# Patient Record
Sex: Male | Born: 1955 | Race: White | Hispanic: No | Marital: Married | Smoking: Former smoker
Health system: Southern US, Community
[De-identification: ages and names within clinical notes are randomized; demographics above are authoritative.]

## PROBLEM LIST (undated history)

## (undated) DIAGNOSIS — M450 Ankylosing spondylitis of multiple sites in spine: Secondary | ICD-10-CM

## (undated) DIAGNOSIS — Z87891 Personal history of nicotine dependence: Secondary | ICD-10-CM

## (undated) DIAGNOSIS — R079 Chest pain, unspecified: Secondary | ICD-10-CM

## (undated) HISTORY — DX: Ankylosing spondylitis of multiple sites in spine: M45.0

---

## 2007-04-20 ENCOUNTER — Emergency Department (HOSPITAL_COMMUNITY): Admission: EM | Admit: 2007-04-20 | Discharge: 2007-04-20 | Payer: Self-pay | Admitting: Emergency Medicine

## 2007-09-12 ENCOUNTER — Encounter (INDEPENDENT_AMBULATORY_CARE_PROVIDER_SITE_OTHER): Payer: Self-pay | Admitting: Internal Medicine

## 2007-09-12 ENCOUNTER — Ambulatory Visit: Payer: Self-pay | Admitting: Nurse Practitioner

## 2007-09-12 LAB — CONVERTED CEMR LAB
AST: 15 units/L (ref 0–37)
Albumin: 4.5 g/dL (ref 3.5–5.2)
Alkaline Phosphatase: 93 units/L (ref 39–117)
Basophils Absolute: 0 10*3/uL (ref 0.0–0.1)
Basophils Relative: 0 % (ref 0–1)
CO2: 23 meq/L (ref 19–32)
Chloride: 104 meq/L (ref 96–112)
Creatinine, Ser: 0.79 mg/dL (ref 0.40–1.50)
Hemoglobin: 15.5 g/dL (ref 13.0–17.0)
Lymphs Abs: 1.9 10*3/uL (ref 0.7–4.0)
Monocytes Absolute: 0.6 10*3/uL (ref 0.1–1.0)
RBC: 5.39 M/uL (ref 4.22–5.81)
RDW: 13.9 % (ref 11.5–15.5)
Rhuematoid fact SerPl-aCnc: <20 intl units/mL (ref 0–20)
Total Protein: 7.6 g/dL (ref 6.0–8.3)
WBC: 9.9 10*3/uL (ref 4.0–10.5)

## 2007-09-21 ENCOUNTER — Ambulatory Visit: Payer: Self-pay | Admitting: Internal Medicine

## 2007-09-21 DIAGNOSIS — M2550 Pain in unspecified joint: Secondary | ICD-10-CM | POA: Insufficient documentation

## 2007-09-25 ENCOUNTER — Encounter (INDEPENDENT_AMBULATORY_CARE_PROVIDER_SITE_OTHER): Payer: Self-pay | Admitting: Internal Medicine

## 2007-09-25 DIAGNOSIS — H209 Unspecified iridocyclitis: Secondary | ICD-10-CM

## 2007-10-09 ENCOUNTER — Encounter (INDEPENDENT_AMBULATORY_CARE_PROVIDER_SITE_OTHER): Payer: Self-pay | Admitting: Internal Medicine

## 2007-10-10 ENCOUNTER — Encounter (INDEPENDENT_AMBULATORY_CARE_PROVIDER_SITE_OTHER): Payer: Self-pay | Admitting: Internal Medicine

## 2007-10-23 ENCOUNTER — Telehealth (INDEPENDENT_AMBULATORY_CARE_PROVIDER_SITE_OTHER): Payer: Self-pay | Admitting: Internal Medicine

## 2007-10-29 ENCOUNTER — Telehealth (INDEPENDENT_AMBULATORY_CARE_PROVIDER_SITE_OTHER): Payer: Self-pay | Admitting: Internal Medicine

## 2007-12-12 ENCOUNTER — Encounter (INDEPENDENT_AMBULATORY_CARE_PROVIDER_SITE_OTHER): Payer: Self-pay | Admitting: Internal Medicine

## 2008-03-04 ENCOUNTER — Telehealth (INDEPENDENT_AMBULATORY_CARE_PROVIDER_SITE_OTHER): Payer: Self-pay | Admitting: Internal Medicine

## 2008-03-05 ENCOUNTER — Ambulatory Visit: Payer: Self-pay | Admitting: Internal Medicine

## 2008-03-07 ENCOUNTER — Ambulatory Visit: Payer: Self-pay | Admitting: Internal Medicine

## 2008-03-12 ENCOUNTER — Telehealth (INDEPENDENT_AMBULATORY_CARE_PROVIDER_SITE_OTHER): Payer: Self-pay | Admitting: Internal Medicine

## 2008-03-14 ENCOUNTER — Encounter (INDEPENDENT_AMBULATORY_CARE_PROVIDER_SITE_OTHER): Payer: Self-pay | Admitting: Internal Medicine

## 2008-03-31 ENCOUNTER — Telehealth (INDEPENDENT_AMBULATORY_CARE_PROVIDER_SITE_OTHER): Payer: Self-pay | Admitting: Internal Medicine

## 2008-04-10 ENCOUNTER — Telehealth (INDEPENDENT_AMBULATORY_CARE_PROVIDER_SITE_OTHER): Payer: Self-pay | Admitting: Internal Medicine

## 2008-05-09 ENCOUNTER — Ambulatory Visit: Payer: Self-pay | Admitting: Internal Medicine

## 2008-05-09 DIAGNOSIS — F17200 Nicotine dependence, unspecified, uncomplicated: Secondary | ICD-10-CM

## 2008-05-09 DIAGNOSIS — N410 Acute prostatitis: Secondary | ICD-10-CM

## 2008-05-09 LAB — CONVERTED CEMR LAB
Bilirubin Urine: NEGATIVE
Glucose, Urine, Semiquant: NEGATIVE
Nitrite: NEGATIVE
WBC Urine, dipstick: NEGATIVE
pH: 7

## 2008-05-20 ENCOUNTER — Encounter (INDEPENDENT_AMBULATORY_CARE_PROVIDER_SITE_OTHER): Payer: Self-pay | Admitting: Internal Medicine

## 2008-06-02 ENCOUNTER — Encounter (INDEPENDENT_AMBULATORY_CARE_PROVIDER_SITE_OTHER): Payer: Self-pay | Admitting: Internal Medicine

## 2008-06-02 DIAGNOSIS — M459 Ankylosing spondylitis of unspecified sites in spine: Secondary | ICD-10-CM

## 2008-06-12 ENCOUNTER — Encounter (INDEPENDENT_AMBULATORY_CARE_PROVIDER_SITE_OTHER): Payer: Self-pay | Admitting: Internal Medicine

## 2008-09-05 ENCOUNTER — Encounter (INDEPENDENT_AMBULATORY_CARE_PROVIDER_SITE_OTHER): Payer: Self-pay | Admitting: Internal Medicine

## 2008-09-19 ENCOUNTER — Encounter (INDEPENDENT_AMBULATORY_CARE_PROVIDER_SITE_OTHER): Payer: Self-pay | Admitting: Internal Medicine

## 2008-11-26 ENCOUNTER — Emergency Department (HOSPITAL_COMMUNITY): Admission: EM | Admit: 2008-11-26 | Discharge: 2008-11-26 | Payer: Self-pay | Admitting: Emergency Medicine

## 2009-01-17 HISTORY — PX: CARDIAC CATHETERIZATION: SHX172

## 2009-03-11 ENCOUNTER — Encounter (INDEPENDENT_AMBULATORY_CARE_PROVIDER_SITE_OTHER): Payer: Self-pay | Admitting: Internal Medicine

## 2009-04-09 ENCOUNTER — Ambulatory Visit: Payer: Self-pay | Admitting: Internal Medicine

## 2009-04-09 ENCOUNTER — Encounter (INDEPENDENT_AMBULATORY_CARE_PROVIDER_SITE_OTHER): Payer: Self-pay | Admitting: Internal Medicine

## 2009-04-09 ENCOUNTER — Observation Stay (HOSPITAL_COMMUNITY): Admission: EM | Admit: 2009-04-09 | Discharge: 2009-04-11 | Payer: Self-pay | Admitting: Emergency Medicine

## 2010-02-16 NOTE — Letter (Signed)
Summary: [facility]  [facility]   Imported By: Arta Bruce 12/15/2009 14:46:28  _____________________________________________________________________  External Attachment:    Type:   Image     Comment:   External Document

## 2010-02-16 NOTE — Letter (Signed)
Summary: [facility]  [facility]AT[facility]CL[facility]UN[facility]   Imported By: Arta Bruce 09/08/2009 12:56:57  _____________________________________________________________________  External Attachment:    Type:   Image     Comment:   External Document

## 2010-04-10 ENCOUNTER — Emergency Department (HOSPITAL_COMMUNITY)
Admission: EM | Admit: 2010-04-10 | Discharge: 2010-04-10 | Disposition: A | Discharge status: Home / Self Care | Payer: Medicaid Other | Attending: Emergency Medicine | Admitting: Emergency Medicine

## 2010-04-10 DIAGNOSIS — L03019 Cellulitis of unspecified finger: Secondary | ICD-10-CM | POA: Insufficient documentation

## 2010-04-10 DIAGNOSIS — M79609 Pain in unspecified limb: Secondary | ICD-10-CM | POA: Insufficient documentation

## 2010-04-10 DIAGNOSIS — L02519 Cutaneous abscess of unspecified hand: Secondary | ICD-10-CM | POA: Insufficient documentation

## 2010-04-10 DIAGNOSIS — M7989 Other specified soft tissue disorders: Secondary | ICD-10-CM | POA: Insufficient documentation

## 2010-04-10 DIAGNOSIS — M069 Rheumatoid arthritis, unspecified: Secondary | ICD-10-CM | POA: Insufficient documentation

## 2010-04-11 ENCOUNTER — Emergency Department (HOSPITAL_COMMUNITY)
Admission: EM | Admit: 2010-04-11 | Discharge: 2010-04-11 | Disposition: A | Discharge status: Home / Self Care | Payer: Medicaid Other | Attending: Emergency Medicine | Admitting: Emergency Medicine

## 2010-04-11 DIAGNOSIS — M79609 Pain in unspecified limb: Secondary | ICD-10-CM | POA: Insufficient documentation

## 2010-04-11 DIAGNOSIS — L02519 Cutaneous abscess of unspecified hand: Secondary | ICD-10-CM | POA: Insufficient documentation

## 2010-04-11 DIAGNOSIS — Z09 Encounter for follow-up examination after completed treatment for conditions other than malignant neoplasm: Secondary | ICD-10-CM | POA: Insufficient documentation

## 2010-04-11 DIAGNOSIS — L03019 Cellulitis of unspecified finger: Secondary | ICD-10-CM | POA: Insufficient documentation

## 2010-04-11 DIAGNOSIS — Z79899 Other long term (current) drug therapy: Secondary | ICD-10-CM | POA: Insufficient documentation

## 2010-04-11 DIAGNOSIS — M069 Rheumatoid arthritis, unspecified: Secondary | ICD-10-CM | POA: Insufficient documentation

## 2010-04-12 LAB — LIPID PANEL
Cholesterol: 177 mg/dL (ref 0–200)
HDL: 27 mg/dL — ABNORMAL LOW (ref >39)
Total CHOL/HDL Ratio: 6.6 RATIO
Triglycerides: 213 mg/dL — ABNORMAL HIGH (ref <150)
VLDL: 43 mg/dL — ABNORMAL HIGH (ref 0–40)

## 2010-04-12 LAB — COMPREHENSIVE METABOLIC PANEL
ALT: 19 U/L (ref 0–53)
AST: 17 U/L (ref 0–37)
Alkaline Phosphatase: 64 U/L (ref 39–117)
BUN: 13 mg/dL (ref 6–23)
CO2: 22 mEq/L (ref 19–32)
Calcium: 8.9 mg/dL (ref 8.4–10.5)
Creatinine, Ser: 0.84 mg/dL (ref 0.4–1.5)
Potassium: 3.9 mEq/L (ref 3.5–5.1)
Sodium: 137 mEq/L (ref 135–145)
Total Protein: 6.8 g/dL (ref 6.0–8.3)

## 2010-04-12 LAB — BASIC METABOLIC PANEL
CO2: 27 mEq/L (ref 19–32)
Creatinine, Ser: 0.79 mg/dL (ref 0.4–1.5)
GFR calc Af Amer: >60 mL/min (ref >60)
Glucose, Bld: 96 mg/dL (ref 70–99)
Potassium: 4.1 mEq/L (ref 3.5–5.1)
Sodium: 136 mEq/L (ref 135–145)

## 2010-04-12 LAB — CARDIAC PANEL(CRET KIN+CKTOT+MB+TROPI)
CK, MB: 1 ng/mL (ref 0.3–4.0)
Relative Index: INVALID (ref 0.0–2.5)
Total CK: 62 U/L (ref 7–232)
Total CK: 67 U/L (ref 7–232)
Troponin I: 0.01 ng/mL (ref 0.00–0.06)
Troponin I: <0.01 ng/mL (ref 0.00–0.06)

## 2010-04-12 LAB — HEMOGLOBIN A1C: Mean Plasma Glucose: 111 mg/dL

## 2010-04-12 LAB — CBC
MCV: 90.3 fL (ref 78.0–100.0)
MCV: 90.4 fL (ref 78.0–100.0)
Platelets: 215 10*3/uL (ref 150–400)
RBC: 5.11 MIL/uL (ref 4.22–5.81)
RBC: 5.18 MIL/uL (ref 4.22–5.81)
WBC: 9.1 10*3/uL (ref 4.0–10.5)

## 2010-04-12 LAB — DIFFERENTIAL
Basophils Relative: 1 % (ref 0–1)
Eosinophils Absolute: 0.1 10*3/uL (ref 0.0–0.7)
Eosinophils Relative: 2 % (ref 0–5)
Lymphs Abs: 2.2 10*3/uL (ref 0.7–4.0)
Lymphs Abs: 4.1 10*3/uL — ABNORMAL HIGH (ref 0.7–4.0)
Monocytes Absolute: 0.7 10*3/uL (ref 0.1–1.0)
Neutro Abs: 5.2 10*3/uL (ref 1.7–7.7)
Neutrophils Relative %: 45 % (ref 43–77)
Neutrophils Relative %: 64 % (ref 43–77)

## 2010-04-12 LAB — PHOSPHORUS: Phosphorus: 3.1 mg/dL (ref 2.3–4.6)

## 2010-04-12 LAB — TSH: TSH: 2.162 u[IU]/mL (ref 0.350–4.500)

## 2010-04-12 LAB — CK TOTAL AND CKMB (NOT AT ARMC): CK, MB: 1 ng/mL (ref 0.3–4.0)

## 2010-04-12 LAB — MAGNESIUM: Magnesium: 2.1 mg/dL (ref 1.5–2.5)

## 2010-04-12 LAB — TROPONIN I: Troponin I: 0.01 ng/mL (ref 0.00–0.06)

## 2010-06-11 ENCOUNTER — Other Ambulatory Visit: Payer: Self-pay | Admitting: Family Medicine

## 2010-06-11 ENCOUNTER — Ambulatory Visit
Admission: RE | Admit: 2010-06-11 | Discharge: 2010-06-11 | Disposition: A | Discharge status: Home / Self Care | Payer: Medicaid Other | Source: Ambulatory Visit | Attending: Family Medicine | Admitting: Family Medicine

## 2010-06-11 DIAGNOSIS — R0600 Dyspnea, unspecified: Secondary | ICD-10-CM

## 2012-05-03 ENCOUNTER — Telehealth: Payer: Self-pay | Admitting: Family Medicine

## 2012-05-03 MED ORDER — HYDROCODONE-ACETAMINOPHEN 7.5-325 MG PO TABS: 1.0000 | ORAL_TABLET | Freq: Four times a day (QID) | ORAL | Status: DC | PRN

## 2012-05-03 NOTE — Telephone Encounter (Signed)
Confusion with pain med refill.  Per provider only refill #120 every 30 days.  Refill request from pharmacy reflected refill was last done on 3/28.  Pt states only picks up 1/2 of Rx at one time due to cost.  Pharmacy was called and it was confirmed last Rx refill called in on 04/02/12.  That the 3/28 date was when pt pick up last half of Rx and today was monthly due date. Rx was then approved.

## 2012-05-03 NOTE — Telephone Encounter (Signed)
Pharmacy aware

## 2012-05-03 NOTE — Telephone Encounter (Signed)
Pharmacy called.  Refill not due until 4/28

## 2012-05-03 NOTE — Telephone Encounter (Signed)
?   OK to Refill  

## 2012-05-03 NOTE — Telephone Encounter (Signed)
Refused until 4/28 not time

## 2012-05-03 NOTE — Addendum Note (Signed)
Addended by: Donne Anon on: 05/03/2012 12:29 PM   Modules accepted: Orders

## 2012-05-03 NOTE — Telephone Encounter (Addendum)
Last refill per pharmacy 04/13/12.  Refill denied TOO SOON. Routed to Dr Quillian Quince

## 2012-05-31 ENCOUNTER — Telehealth: Payer: Self-pay | Admitting: Family Medicine

## 2012-05-31 NOTE — Telephone Encounter (Signed)
Ok to refill 

## 2012-05-31 NOTE — Telephone Encounter (Signed)
?   OK to Refill  

## 2012-06-01 MED ORDER — HYDROCODONE-ACETAMINOPHEN 7.5-325 MG PO TABS: 1.0000 | ORAL_TABLET | Freq: Four times a day (QID) | ORAL | Status: DC | PRN

## 2012-06-01 NOTE — Telephone Encounter (Signed)
Rx Refilled  

## 2012-07-03 ENCOUNTER — Telehealth: Payer: Self-pay | Admitting: Family Medicine

## 2012-07-03 NOTE — Telephone Encounter (Signed)
Ok to refill 

## 2012-07-03 NOTE — Telephone Encounter (Signed)
?   OK to Refill  

## 2012-07-04 MED ORDER — HYDROCODONE-ACETAMINOPHEN 7.5-325 MG PO TABS: 1.0000 | ORAL_TABLET | Freq: Four times a day (QID) | ORAL | Status: DC | PRN

## 2012-07-04 NOTE — Telephone Encounter (Signed)
Rx Refilled  

## 2012-08-03 ENCOUNTER — Telehealth: Payer: Self-pay | Admitting: Family Medicine

## 2012-08-03 MED ORDER — HYDROCODONE-ACETAMINOPHEN 7.5-325 MG PO TABS: 1.0000 | ORAL_TABLET | Freq: Four times a day (QID) | ORAL | Status: DC | PRN

## 2012-08-03 NOTE — Telephone Encounter (Signed)
?   OK to Refill  

## 2012-08-03 NOTE — Telephone Encounter (Signed)
Rx Refilled  

## 2012-08-03 NOTE — Telephone Encounter (Signed)
Ok to refill 

## 2012-09-05 ENCOUNTER — Telehealth: Payer: Self-pay | Admitting: Family Medicine

## 2012-09-05 NOTE — Telephone Encounter (Signed)
Norco 7.5/325 mg tab 1 q6 hours prn pain

## 2012-09-06 MED ORDER — HYDROCODONE-ACETAMINOPHEN 7.5-325 MG PO TABS: 1.0000 | ORAL_TABLET | Freq: Four times a day (QID) | ORAL | Status: DC | PRN

## 2012-09-06 NOTE — Telephone Encounter (Signed)
?   OK to Refill  

## 2012-09-06 NOTE — Telephone Encounter (Signed)
Rx Refilled  

## 2012-09-06 NOTE — Telephone Encounter (Signed)
ok 

## 2012-10-02 ENCOUNTER — Telehealth: Payer: Self-pay | Admitting: Family Medicine

## 2012-10-02 MED ORDER — HYDROCODONE-ACETAMINOPHEN 7.5-325 MG PO TABS: 1.0000 | ORAL_TABLET | Freq: Four times a day (QID) | ORAL | Status: DC | PRN

## 2012-10-02 NOTE — Telephone Encounter (Signed)
Norco 7.5-325 mg 1 q6 hours prn

## 2012-10-02 NOTE — Telephone Encounter (Signed)
ok 

## 2012-10-02 NOTE — Telephone Encounter (Signed)
Rx Refilled  

## 2012-10-02 NOTE — Addendum Note (Signed)
Addended by: Legrand Rams B on: 10/02/2012 12:06 PM   Modules accepted: Orders

## 2012-10-02 NOTE — Telephone Encounter (Signed)
?   OK to Refill  

## 2012-10-23 ENCOUNTER — Telehealth: Payer: Self-pay | Admitting: Family Medicine

## 2012-10-23 NOTE — Telephone Encounter (Signed)
Paper filled out and .Patient aware per vm

## 2012-10-23 NOTE — Telephone Encounter (Signed)
Patient is calling about his renewal form for his handy -cap sticker . It expired last moth.  Patient is going out of town tomorrow.

## 2012-11-02 ENCOUNTER — Telehealth: Payer: Self-pay | Admitting: Family Medicine

## 2012-11-02 NOTE — Telephone Encounter (Signed)
Patient needs his NORCO refilled. Ran out on the 11-01-2012.

## 2012-11-02 NOTE — Telephone Encounter (Signed)
OK refill?? 

## 2012-11-05 ENCOUNTER — Telehealth: Payer: Self-pay | Admitting: Family Medicine

## 2012-11-05 MED ORDER — HYDROCODONE-ACETAMINOPHEN 7.5-325 MG PO TABS: 1.0000 | ORAL_TABLET | Freq: Four times a day (QID) | ORAL | Status: DC | PRN

## 2012-11-05 NOTE — Telephone Encounter (Signed)
Needs pain medication Norco-

## 2012-11-05 NOTE — Telephone Encounter (Signed)
Pt aware RX ready

## 2012-11-05 NOTE — Telephone Encounter (Signed)
Rx printed for provider signature.  Left pt mess to call back.  Inform he needs to come pick up

## 2012-11-30 ENCOUNTER — Telehealth: Payer: Self-pay | Admitting: *Deleted

## 2012-12-03 NOTE — Telephone Encounter (Signed)
.?   OK to Refill (Not due till 12/06/12)

## 2012-12-04 MED ORDER — HYDROCODONE-ACETAMINOPHEN 7.5-325 MG PO TABS: 1.0000 | ORAL_TABLET | Freq: Four times a day (QID) | ORAL | Status: DC | PRN

## 2012-12-04 NOTE — Telephone Encounter (Signed)
ok 

## 2012-12-04 NOTE — Telephone Encounter (Signed)
RX printed, left up front and patient aware to pick up per vm 

## 2012-12-31 ENCOUNTER — Telehealth: Payer: Self-pay | Admitting: Family Medicine

## 2012-12-31 NOTE — Telephone Encounter (Signed)
?   OK to Refill and to do 3 months?? 

## 2012-12-31 NOTE — Telephone Encounter (Signed)
Pt is needing a refill on his hydrocodone  Call back number is [phone]

## 2013-01-01 MED ORDER — HYDROCODONE-ACETAMINOPHEN 7.5-325 MG PO TABS: 1.0000 | ORAL_TABLET | Freq: Four times a day (QID) | ORAL | Status: DC | PRN

## 2013-01-01 NOTE — Telephone Encounter (Signed)
ok 

## 2013-01-01 NOTE — Telephone Encounter (Signed)
RX printed, left up front and patient aware to pick up per vm 

## 2013-05-02 ENCOUNTER — Telehealth: Payer: Self-pay | Admitting: Family Medicine

## 2013-05-02 MED ORDER — HYDROCODONE-ACETAMINOPHEN 7.5-325 MG PO TABS: 1.0000 | ORAL_TABLET | Freq: Four times a day (QID) | ORAL | Status: DC | PRN

## 2013-05-02 NOTE — Telephone Encounter (Signed)
Message copied by Ricard DillonWILLIS, SANDY B on Thu May 02, 2013  4:57 PM ------      Message from: Malvin JohnsBULLINS, SUSAN S      Created: Thu May 02, 2013  9:54 AM       PATIENT WOULD LIKE TO SPEAK WITH YOU ABOUT A REFILL       [phone] ------

## 2013-05-02 NOTE — Telephone Encounter (Signed)
rx ok's by Dr. Rachael FeePickard RX printed x 3 months, left up front and patient aware to pick up

## 2013-08-02 ENCOUNTER — Telehealth: Payer: Self-pay | Admitting: Family Medicine

## 2013-08-02 MED ORDER — HYDROCODONE-ACETAMINOPHEN 7.5-325 MG PO TABS: 1.0000 | ORAL_TABLET | Freq: Four times a day (QID) | ORAL | Status: DC | PRN

## 2013-08-02 NOTE — Telephone Encounter (Signed)
RX printed, left up front and patient aware to pick up and pt is aware that he requires any appt before any further refills.

## 2013-08-02 NOTE — Telephone Encounter (Signed)
ntbs

## 2013-08-02 NOTE — Telephone Encounter (Signed)
Patient would like to pick up rx for hydrocodone  Please call [phone] when ready for pick up

## 2013-08-02 NOTE — Telephone Encounter (Signed)
LMTRC

## 2013-08-02 NOTE — Telephone Encounter (Signed)
?   OK to Refill  - has not been here in the last year

## 2013-09-06 ENCOUNTER — Encounter: Payer: Self-pay | Admitting: Family Medicine

## 2013-09-06 ENCOUNTER — Ambulatory Visit (INDEPENDENT_AMBULATORY_CARE_PROVIDER_SITE_OTHER): Payer: Self-pay | Admitting: Family Medicine

## 2013-09-06 VITALS — BP 120/64 | HR 94 | Temp 99.0°F | Resp 18 | Ht 73.0 in | Wt 231.0 lb

## 2013-09-06 DIAGNOSIS — G8929 Other chronic pain: Secondary | ICD-10-CM

## 2013-09-06 DIAGNOSIS — Z79899 Other long term (current) drug therapy: Secondary | ICD-10-CM

## 2013-09-06 DIAGNOSIS — Z1211 Encounter for screening for malignant neoplasm of colon: Secondary | ICD-10-CM

## 2013-09-06 DIAGNOSIS — M549 Dorsalgia, unspecified: Secondary | ICD-10-CM

## 2013-09-06 DIAGNOSIS — F17200 Nicotine dependence, unspecified, uncomplicated: Secondary | ICD-10-CM | POA: Insufficient documentation

## 2013-09-06 DIAGNOSIS — M450 Ankylosing spondylitis of multiple sites in spine: Secondary | ICD-10-CM | POA: Insufficient documentation

## 2013-09-06 MED ORDER — HYDROCODONE-ACETAMINOPHEN 7.5-325 MG PO TABS: 1.0000 | ORAL_TABLET | Freq: Four times a day (QID) | ORAL | Status: DC | PRN

## 2013-09-06 NOTE — Addendum Note (Signed)
Addended by: Legrand RamsWILLIS, SANDY B on: 09/06/2013 04:34 PM   Modules accepted: Orders

## 2013-09-06 NOTE — Addendum Note (Signed)
Addended by: Reginia FortsATKINS, Rhyse Loux on: 09/06/2013 04:38 PM   Modules accepted: Orders

## 2013-09-06 NOTE — Progress Notes (Signed)
Subjective:    Patient ID: Joshua Kramer, male    DOB: 06/06/1955, 58 y.o.   MRN: [id number]  HPI Patient has a history of ankylosing spondylitis in multiple sites and spine. This causes severe disabling chronic low back pain and upper back pain. The patient is currently taking Norco 7.5/325 mg one tablet every 6 hours as needed for back pain. There is no evidence of abuse or diversion. He is coming in today for medicine check. He denies any drug abuse. He reports compliance with the medication. His pain control was adequate to allow him to work and function. Unfortunately does not have health insurance.  He is overdue for colonoscopy as well as a prostate exam. I recommended both knees today. He is also due to for fasting lab work including a CMP, fasting lipid panel, PSA, CBC. Otherwise he has no complaints. He denies any chest pain shortness of breath or dyspnea on exertion. He denies any coughing. He denies any hemoptysis. On exam today he is wheezing on expiration. He denies any history of emphysema. Past Medical History  Diagnosis Date  . Ankylosing spondylitis of multiple sites in spine   . Smoker    Current Outpatient Prescriptions on File Prior to Visit  Medication Sig Dispense Refill  . HYDROcodone-acetaminophen (NORCO) 7.5-325 MG per tablet Take 1 tablet by mouth every 6 (six) hours as needed.  120 tablet  0   No current facility-administered medications on file prior to visit.   No Known Allergies History   Social History  . Marital Status: Married    Spouse Name: N/A    Number of Children: N/A  . Years of Education: N/A   Occupational History  . Not on file.   Social History Main Topics  . Smoking status: Current Some Day Smoker  . Smokeless tobacco: Never Used  . Alcohol Use: Yes  . Drug Use: No  . Sexual Activity: Yes   Other Topics Concern  . Not on file   Social History Narrative  . No narrative on file      Review of Systems  All other systems reviewed  and are negative.      Objective:   Physical Exam  Vitals reviewed. Neck: Neck supple. No JVD present. No thyromegaly present.  Cardiovascular: Normal rate, regular rhythm and normal heart sounds.   No murmur heard. Pulmonary/Chest: Effort normal. No respiratory distress. He has wheezes. He has no rales.  Abdominal: Soft. Bowel sounds are normal. He exhibits no distension. There is no tenderness. There is no rebound and no guarding.  Musculoskeletal:       Cervical back: He exhibits decreased range of motion, tenderness and pain. He exhibits no spasm.       Lumbar back: He exhibits decreased range of motion, tenderness and pain. He exhibits no spasm.  Lymphadenopathy:    He has no cervical adenopathy.          Assessment & Plan:  Chronic back pain - Plan: COMPLETE METABOLIC PANEL WITH GFR, Drug Screen, Urine  Screen for colon cancer - Plan: Fecal Occult Blood, Guaiac  Encounter for long-term (current) use of other medications  Patient has chronic back pain due to ankylosing spondylitis. There is no evidence of abuse or diversion of his medication.  I will perform a urine drug screen to date monitor for compliance. I will also check a CMP to evaluate for renal or hepatic dysfunction. I have recommended return for a fasting lipid panel as well as  PSA. Patient declines this at the present time due to cost. I also recommended a colonoscopy as well as a prostate exam. The patient declined this as well. He does consent to fecal occult blood cards x3 to screen for colon cancer.

## 2013-09-07 LAB — DRUG SCREEN, URINE
Amphetamine Screen, Ur: NEGATIVE
Barbiturate Quant, Ur: NEGATIVE
Benzodiazepines.: NEGATIVE
CREATININE, U: 401.22 mg/dL
Cocaine Metabolites: NEGATIVE
METHADONE: NEGATIVE
Marijuana Metabolite: NEGATIVE
Opiates: POSITIVE — AB
Phencyclidine (PCP): NEGATIVE
Propoxyphene: NEGATIVE

## 2013-09-07 LAB — COMPLETE METABOLIC PANEL WITH GFR
ALK PHOS: 75 U/L (ref 39–117)
ALT: 11 U/L (ref 0–53)
AST: 11 U/L (ref 0–37)
Albumin: 4 g/dL (ref 3.5–5.2)
BILIRUBIN TOTAL: 0.5 mg/dL (ref 0.2–1.2)
BUN: 14 mg/dL (ref 6–23)
CO2: 27 mEq/L (ref 19–32)
Calcium: 8.8 mg/dL (ref 8.4–10.5)
Chloride: 100 mEq/L (ref 96–112)
Creat: 0.78 mg/dL (ref 0.50–1.35)
GFR, Est African American: >89 mL/min
GFR, Est Non African American: >89 mL/min
GLUCOSE: 125 mg/dL — AB (ref 70–99)
Potassium: 3.9 mEq/L (ref 3.5–5.3)
SODIUM: 139 meq/L (ref 135–145)
TOTAL PROTEIN: 6.8 g/dL (ref 6.0–8.3)

## 2013-10-02 ENCOUNTER — Telehealth: Payer: Self-pay | Admitting: Family Medicine

## 2013-10-02 NOTE — Telephone Encounter (Signed)
Patient calling to get the rx for his hydrocodone  [phone]

## 2013-10-02 NOTE — Telephone Encounter (Signed)
?   OK to Refill  

## 2013-10-03 NOTE — Telephone Encounter (Signed)
ok 

## 2013-10-04 MED ORDER — HYDROCODONE-ACETAMINOPHEN 7.5-325 MG PO TABS: 1.0000 | ORAL_TABLET | Freq: Four times a day (QID) | ORAL | Status: DC | PRN

## 2013-10-04 NOTE — Telephone Encounter (Signed)
RX printed, left up front and patient aware to pick up  

## 2013-10-30 ENCOUNTER — Telehealth: Payer: Self-pay | Admitting: Family Medicine

## 2013-10-30 NOTE — Telephone Encounter (Signed)
[phone] Patient is calling to get rx for his hydrocodone

## 2013-10-30 NOTE — Telephone Encounter (Signed)
?   OK to Refill  

## 2013-10-31 MED ORDER — HYDROCODONE-ACETAMINOPHEN 7.5-325 MG PO TABS: 1.0000 | ORAL_TABLET | Freq: Four times a day (QID) | ORAL | Status: DC | PRN

## 2013-10-31 NOTE — Telephone Encounter (Signed)
RX printed, left up front and patient aware to pick up via vm 

## 2013-10-31 NOTE — Telephone Encounter (Signed)
ok 

## 2013-11-28 ENCOUNTER — Telehealth: Payer: Self-pay | Admitting: Family Medicine

## 2013-11-28 MED ORDER — HYDROCODONE-ACETAMINOPHEN 7.5-325 MG PO TABS: 1.0000 | ORAL_TABLET | Freq: Four times a day (QID) | ORAL | Status: DC | PRN

## 2013-11-28 NOTE — Addendum Note (Signed)
Addended by: Legrand RamsWILLIS, SANDY B on: 11/28/2013 04:46 PM   Modules accepted: Orders

## 2013-11-28 NOTE — Telephone Encounter (Signed)
?   OK to Refill  

## 2013-11-28 NOTE — Telephone Encounter (Signed)
Patient is calling to get his rx for hydrocodone  [phone] he will pick up next week

## 2013-11-28 NOTE — Telephone Encounter (Signed)
RX printed, left up front and patient aware to pick up  

## 2013-11-28 NOTE — Telephone Encounter (Signed)
ok 

## 2013-12-25 ENCOUNTER — Telehealth: Payer: Self-pay | Admitting: Family Medicine

## 2013-12-25 NOTE — Telephone Encounter (Signed)
Ok to refill??  Last office visit 09/06/2013.  Last refill 11/28/2013.  Ok to fill multiple?

## 2013-12-25 NOTE — Telephone Encounter (Signed)
[phone]  Pt is needing a refill on HYDROcodone-acetaminophen (NORCO) 7.5-325 MG per tablet Can pt have a refill for several months so he doesn't have to make the drive from the other side of [place]

## 2013-12-26 MED ORDER — HYDROCODONE-ACETAMINOPHEN 7.5-325 MG PO TABS: 1.0000 | ORAL_TABLET | Freq: Four times a day (QID) | ORAL | Status: DC | PRN

## 2013-12-26 NOTE — Telephone Encounter (Signed)
Ok with multiple

## 2013-12-26 NOTE — Telephone Encounter (Signed)
Prescription printed x3 months and patient made aware to come to office to pick up per VM.

## 2014-03-21 ENCOUNTER — Telehealth: Payer: Self-pay | Admitting: Family Medicine

## 2014-03-21 NOTE — Telephone Encounter (Signed)
?   OK to Refill  

## 2014-03-21 NOTE — Telephone Encounter (Signed)
Patient is calling to say he needs refill on his hydrocodone, if possible 3 month rx since he lives in [place]  [phone]

## 2014-03-21 NOTE — Telephone Encounter (Signed)
ok 

## 2014-03-25 MED ORDER — HYDROCODONE-ACETAMINOPHEN 7.5-325 MG PO TABS: 1.0000 | ORAL_TABLET | Freq: Four times a day (QID) | ORAL | Status: DC | PRN

## 2014-03-25 NOTE — Telephone Encounter (Signed)
RX printed x 3 months, left up front and patient aware to pick up via vm 

## 2014-06-26 ENCOUNTER — Telehealth: Payer: Self-pay | Admitting: Family Medicine

## 2014-06-26 MED ORDER — HYDROCODONE-ACETAMINOPHEN 7.5-325 MG PO TABS: 1.0000 | ORAL_TABLET | Freq: Four times a day (QID) | ORAL | Status: DC | PRN

## 2014-06-26 NOTE — Telephone Encounter (Signed)
?   OK to Refill  

## 2014-06-26 NOTE — Telephone Encounter (Signed)
RX printed x 3 months, left up front and patient aware to pick up via vm 

## 2014-06-26 NOTE — Telephone Encounter (Signed)
ok 

## 2014-06-26 NOTE — Telephone Encounter (Signed)
Patient calling to get refill on hydrocodone that is due for refill on Saturday would like to pick up tomorrow if possible  [phone]

## 2014-09-26 ENCOUNTER — Telehealth: Payer: Self-pay | Admitting: Family Medicine

## 2014-09-26 MED ORDER — HYDROCODONE-ACETAMINOPHEN 7.5-325 MG PO TABS: 1.0000 | ORAL_TABLET | Freq: Four times a day (QID) | ORAL | Status: DC | PRN

## 2014-09-26 NOTE — Telephone Encounter (Signed)
RX printed, left up front and patient aware to pick up via vm and aware via vm to schedule appt.

## 2014-09-26 NOTE — Telephone Encounter (Signed)
Patient calling for rx of his hydrocodone  3 month supply if possible  [phone] (M)

## 2014-09-26 NOTE — Telephone Encounter (Signed)
?   OK to Refill  

## 2014-09-26 NOTE — Telephone Encounter (Signed)
Ok with 1 month, Hasn't been seen in 1 year.  NTBS.

## 2014-10-30 ENCOUNTER — Ambulatory Visit (INDEPENDENT_AMBULATORY_CARE_PROVIDER_SITE_OTHER): Payer: Self-pay | Admitting: Family Medicine

## 2014-10-30 ENCOUNTER — Encounter: Payer: Self-pay | Admitting: Family Medicine

## 2014-10-30 VITALS — BP 132/72 | HR 68 | Temp 98.2°F | Resp 16 | Ht 73.0 in | Wt 234.0 lb

## 2014-10-30 DIAGNOSIS — M459 Ankylosing spondylitis of unspecified sites in spine: Secondary | ICD-10-CM

## 2014-10-30 MED ORDER — HYDROCODONE-ACETAMINOPHEN 7.5-325 MG PO TABS: 1.0000 | ORAL_TABLET | Freq: Four times a day (QID) | ORAL | Status: DC | PRN

## 2014-10-30 MED ORDER — PREDNISONE 10 MG PO TABS
10.0000 mg | ORAL_TABLET | Freq: Every day | ORAL | Status: DC
Start: 2014-10-30 — End: 2015-05-29

## 2014-10-30 NOTE — Progress Notes (Signed)
   Subjective:    Patient ID: Joshua Kramer, male    DOB: 12/02/1955, 59 y.o.   MRN: [id number]  HPI Patient has a history of ankylosing spondylitis. He is tried numerous biologic agents to prevent exacerbations of this under the care of a rheumatologist. He failed many of them either due to cost or secondary infections. He was unable to tolerate enbrel or humira.  He tried sulfasalazine in the past without any benefit. He is currently using Norco 7.5/325 one 4 times a day. He states that his pain is getting worse. He continues to have severe pain in his lower back, in his hands, and in his knees. He has very little range of motion in his neck. His neck is essentially fused. He must turn his entire torso to look laterally. He has inflammatory swelling in the DIP joint on his right fourth digit.  He is interested in increasing the frequency of his pain medication. Past Medical History  Diagnosis Date  . Ankylosing spondylitis of multiple sites in spine (HCC)   . Smoker    No past surgical history on file. No current outpatient prescriptions on file prior to visit.   No current facility-administered medications on file prior to visit.   No Known Allergies Social History   Social History  . Marital Status: Married    Spouse Name: N/A  . Number of Children: N/A  . Years of Education: N/A   Occupational History  . Not on file.   Social History Main Topics  . Smoking status: Former Smoker    Quit date: 06/18/2014  . Smokeless tobacco: Never Used  . Alcohol Use: No  . Drug Use: No  . Sexual Activity: Yes   Other Topics Concern  . Not on file   Social History Narrative      Review of Systems  All other systems reviewed and are negative.      Objective:   Physical Exam  Constitutional: He appears well-developed and well-nourished.  Cardiovascular: Normal rate, regular rhythm and normal heart sounds.   Pulmonary/Chest: Effort normal and breath sounds normal.  Musculoskeletal:        Right knee: He exhibits decreased range of motion. Tenderness found. Medial joint line and lateral joint line tenderness noted.       Cervical back: He exhibits decreased range of motion and tenderness.       Thoracic back: He exhibits decreased range of motion and tenderness.       Right hand: He exhibits decreased range of motion, tenderness and bony tenderness.  Vitals reviewed.         Assessment & Plan:  Ankylosing spondylitis (HCC) - Plan: predniSONE (DELTASONE) 10 MG tablet, COMPLETE METABOLIC PANEL WITH GFR  I am fine refilling the patient's Norco. He can continue to take this 4 times a day. I will give him 120 tablets with 2 refills. Patient has no insurance and therefore he cannot afford the biologic agents. He cannot afford lab work. However I insisted that we get a CMP to monitor his kidney/liver function and blood sugar. I will also start the patient on prednisone 10 mg a day empirically for one month to see if his quality of life improves. If it does, we may want to consider continuing this long-term and then have a discussion regarding the side effects that prolonged glucocorticoid use can cause.

## 2014-10-31 ENCOUNTER — Encounter: Payer: Self-pay | Admitting: Family Medicine

## 2014-10-31 LAB — COMPLETE METABOLIC PANEL WITH GFR
ALT: 13 U/L (ref 9–46)
AST: 12 U/L (ref 10–35)
Albumin: 4.3 g/dL (ref 3.6–5.1)
Alkaline Phosphatase: 77 U/L (ref 40–115)
BILIRUBIN TOTAL: 0.8 mg/dL (ref 0.2–1.2)
BUN: 20 mg/dL (ref 7–25)
CHLORIDE: 104 mmol/L (ref 98–110)
CO2: 23 mmol/L (ref 20–31)
CREATININE: 0.72 mg/dL (ref 0.70–1.33)
Calcium: 9.5 mg/dL (ref 8.6–10.3)
GFR, Est African American: >89 mL/min (ref >=60)
GFR, Est Non African American: >89 mL/min (ref >=60)
GLUCOSE: 82 mg/dL (ref 70–99)
Potassium: 4.3 mmol/L (ref 3.5–5.3)
SODIUM: 137 mmol/L (ref 135–146)
TOTAL PROTEIN: 7.4 g/dL (ref 6.1–8.1)

## 2015-01-06 ENCOUNTER — Emergency Department (HOSPITAL_COMMUNITY)
Admission: EM | Admit: 2015-01-06 | Discharge: 2015-01-06 | Disposition: A | Discharge status: Home / Self Care | Payer: Medicaid Other | Attending: Emergency Medicine | Admitting: Emergency Medicine

## 2015-01-06 ENCOUNTER — Encounter (HOSPITAL_COMMUNITY): Payer: Self-pay | Admitting: Emergency Medicine

## 2015-01-06 DIAGNOSIS — Z87891 Personal history of nicotine dependence: Secondary | ICD-10-CM | POA: Insufficient documentation

## 2015-01-06 DIAGNOSIS — K047 Periapical abscess without sinus: Secondary | ICD-10-CM

## 2015-01-06 DIAGNOSIS — Z7952 Long term (current) use of systemic steroids: Secondary | ICD-10-CM | POA: Insufficient documentation

## 2015-01-06 DIAGNOSIS — K002 Abnormalities of size and form of teeth: Secondary | ICD-10-CM | POA: Insufficient documentation

## 2015-01-06 DIAGNOSIS — Z8739 Personal history of other diseases of the musculoskeletal system and connective tissue: Secondary | ICD-10-CM | POA: Insufficient documentation

## 2015-01-06 DIAGNOSIS — R110 Nausea: Secondary | ICD-10-CM | POA: Insufficient documentation

## 2015-01-06 DIAGNOSIS — R6883 Chills (without fever): Secondary | ICD-10-CM | POA: Insufficient documentation

## 2015-01-06 DIAGNOSIS — K029 Dental caries, unspecified: Secondary | ICD-10-CM | POA: Insufficient documentation

## 2015-01-06 DIAGNOSIS — R51 Headache: Secondary | ICD-10-CM | POA: Insufficient documentation

## 2015-01-06 MED ORDER — PENICILLIN V POTASSIUM 500 MG PO TABS: 500.0000 mg | ORAL_TABLET | Freq: Four times a day (QID) | ORAL | Status: DC

## 2015-01-06 MED ORDER — ONDANSETRON 4 MG PO TBDP
4.0000 mg | ORAL_TABLET | Freq: Once | ORAL | Status: AC
  Administered 2015-01-06: 4 mg via ORAL
  Filled 2015-01-06: qty 1

## 2015-01-06 MED ORDER — ACETAMINOPHEN 500 MG PO TABS
1000.0000 mg | ORAL_TABLET | Freq: Once | ORAL | Status: AC
  Administered 2015-01-06: 1000 mg via ORAL
  Filled 2015-01-06: qty 2

## 2015-01-06 MED ORDER — PENICILLIN V POTASSIUM 250 MG PO TABS
500.0000 mg | ORAL_TABLET | Freq: Once | ORAL | Status: AC
  Administered 2015-01-06: 500 mg via ORAL
  Filled 2015-01-06: qty 2

## 2015-01-06 MED ORDER — HYDROCODONE-ACETAMINOPHEN 5-325 MG PO TABS: 2.0000 | ORAL_TABLET | Freq: Once | ORAL | Status: DC

## 2015-01-06 MED ORDER — HYDROCODONE-ACETAMINOPHEN 5-325 MG PO TABS: 1.0000 | ORAL_TABLET | ORAL | Status: DC | PRN

## 2015-01-06 NOTE — ED Notes (Signed)
Pt here with right lower dental pain and swelling x 2 days

## 2015-01-06 NOTE — ED Notes (Signed)
Patient states he is driving and doesn't want narcotics here.   Patient wanted tylenol.  PA advised.

## 2015-01-06 NOTE — Discharge Instructions (Signed)
Dental Abscess °A dental abscess is a collection of pus in or around a tooth. °CAUSES °This condition is caused by a bacterial infection around the root of the tooth that involves the inner part of the tooth (pulp). It may result from: °· Severe tooth decay. °· Trauma to the tooth that allows bacteria to enter into the pulp, such as a broken or chipped tooth. °· Severe gum disease around a tooth. °SYMPTOMS °Symptoms of this condition include: °· Severe pain in and around the infected tooth. °· Swelling and redness around the infected tooth, in the mouth, or in the face. °· Tenderness. °· Pus drainage. °· Bad breath. °· Bitter taste in the mouth. °· Difficulty swallowing. °· Difficulty opening the mouth. °· Nausea. °· Vomiting. °· Chills. °· Swollen neck glands. °· Fever. °DIAGNOSIS °This condition is diagnosed with examination of the infected tooth. During the exam, your dentist may tap on the infected tooth. Your dentist will also ask about your medical and dental history and may order X-rays. °TREATMENT °This condition is treated by eliminating the infection. This may be done with: °· Antibiotic medicine. °· A root canal. This may be performed to save the tooth. °· Pulling (extracting) the tooth. This may also involve draining the abscess. This is done if the tooth cannot be saved. °HOME CARE INSTRUCTIONS °· Take medicines only as directed by your dentist. °· If you were prescribed antibiotic medicine, finish all of it even if you start to feel better. °· Rinse your mouth (gargle) often with salt water to relieve pain or swelling. °· Do not drive or operate heavy machinery while taking pain medicine. °· Do not apply heat to the outside of your mouth. °· Keep all follow-up visits as directed by your dentist. This is important. °SEEK MEDICAL CARE IF: °· Your pain is worse and is not helped by medicine. °SEEK IMMEDIATE MEDICAL CARE IF: °· You have a fever or chills. °· Your symptoms suddenly get worse. °· You have a  very bad headache. °· You have problems breathing or swallowing. °· You have trouble opening your mouth. °· You have swelling in your neck or around your eye. °  °This information is not intended to replace advice given to you by your health care provider. Make sure you discuss any questions you have with your health care provider. °  °Document Released: 01/03/2005 Document Revised: 05/20/2014 Document Reviewed: 12/31/2013 °Elsevier Interactive Patient Education ©2016 [employer] ° °Dental Pain °Dental pain may be caused by many things, including: °· Tooth decay (cavities or caries). Cavities expose the nerve of your tooth to air and hot or cold temperatures. This can cause pain or discomfort. °· Abscess or infection. A dental abscess is a collection of infected pus from a bacterial infection in the inner part of the tooth (pulp). It usually occurs at the end of the tooth's root. °· Injury. °· An unknown reason (idiopathic). °Your pain may be mild or severe. It may only occur when: °· You are chewing. °· You are exposed to hot or cold temperature. °· You are eating or drinking sugary foods or beverages, such as soda or candy. °Your pain may also be constant. °HOME CARE INSTRUCTIONS °Watch your dental pain for any changes. The following actions may help to lessen any discomfort that you are feeling: °· Take medicines only as directed by your dentist. °· If you were prescribed an antibiotic medicine, finish all of it even if you start to feel better. °· Keep all   follow-up visits as directed by your dentist. This is important. °· Do not apply heat to the outside of your face. °· Rinse your mouth or gargle with salt water if directed by your dentist. This helps with pain and swelling. °¨ You can make salt water by adding ¼ tsp of salt to 1 cup of warm water. °· Apply ice to the painful area of your face: °¨ Put ice in a plastic bag. °¨ Place a towel between your skin and the bag. °¨ Leave the ice on for 20 minutes,  2-3 times per day. °· Avoid foods or drinks that cause you pain, such as: °¨ Very hot or very cold foods or drinks. °¨ Sweet or sugary foods or drinks. °SEEK MEDICAL CARE IF: °· Your pain is not controlled with medicines. °· Your symptoms are worse. °· You have new symptoms. °SEEK IMMEDIATE MEDICAL CARE IF: °· You are unable to open your mouth. °· You are having trouble breathing or swallowing. °· You have a fever. °· Your face, neck, or jaw is swollen. °  °This information is not intended to replace advice given to you by your health care provider. Make sure you discuss any questions you have with your health care provider. °  °Document Released: 01/03/2005 Document Revised: 05/20/2014 Document Reviewed: 12/30/2013 °Elsevier Interactive Patient Education ©2016 [employer] ° °

## 2015-01-06 NOTE — ED Provider Notes (Signed)
CSN: [id number]     Arrival date & time 01/06/15  1333 History  By signing my name below, I, Joshua Kramer, attest that this documentation has been prepared under the direction and in the presence of Danelle BerryLeisa Ramonica Grigg, PA-C Electronically Signed: Gonzella LexKimberly Bianca Kramer, Scribe. 01/06/2015. 2:25 PM.    Chief Complaint  Patient presents with  . Dental Pain    The history is provided by the patient.     HPI Comments: Joshua Kramer is a 59 y.o. male who presents to the Emergency Department complaining of dental pain onset two or three days ago with associated chills and worsening pain this morning. He also reports a bad taste in his mouth but is unsure if it is drainage. He also reports nausea and HA this morning which have since resolved. Pt reports having a chip in one of his bottom right teeth. He notes that he has been unable to eat anything and reports that he has just been drinking water. Pt has taken Advil with little to no relief. He denies fever and vomiting. Pt is currently just taking Advil and Aleve. Pt has NKDA.   Past Medical History  Diagnosis Date  . Ankylosing spondylitis of multiple sites in spine (HCC)   . Smoker    History reviewed. No pertinent past surgical history. History reviewed. No pertinent family history. Social History  Substance Use Topics  . Smoking status: Former Smoker    Quit date: 06/18/2014  . Smokeless tobacco: Never Used  . Alcohol Use: No    Review of Systems  Constitutional: Positive for chills. Negative for fever.  HENT: Positive for dental problem.   Eyes: Negative.   Respiratory: Negative.   Cardiovascular: Negative.   Gastrointestinal: Positive for nausea. Negative for vomiting.  Endocrine: Negative.   Genitourinary: Negative.   Musculoskeletal: Negative.   Skin: Negative.  Negative for color change.  Neurological: Positive for headaches.  Hematological: Negative.   Psychiatric/Behavioral: Negative.     Allergies  Review of  patient's allergies indicates no known allergies.  Home Medications   Prior to Admission medications   Medication Sig Start Date End Date Taking? Authorizing Provider  HYDROcodone-acetaminophen (NORCO/VICODIN) 5-325 MG tablet Take 1-2 tablets by mouth every 4 (four) hours as needed. 01/06/15   Danelle BerryLeisa Fara Worthy, PA-C  penicillin v potassium (VEETID) 500 MG tablet Take 1 tablet (500 mg total) by mouth 4 (four) times daily. 01/06/15   Danelle BerryLeisa Indiana Pechacek, PA-C  predniSONE (DELTASONE) 10 MG tablet Take 1 tablet (10 mg total) by mouth daily with breakfast. 10/30/14   Donita BrooksWarren T Pickard, MD   BP 128/82 mmHg  Pulse 76  Temp(Src) 99.6 F (37.6 C) (Oral)  Resp 20  SpO2 98% Physical Exam  Constitutional: He is oriented to person, place, and time. He appears well-developed and well-nourished. No distress.  HENT:  Head: Normocephalic and atraumatic.    Right Ear: External ear normal.  Left Ear: External ear normal.  Nose: Nose normal.  Mouth/Throat: Uvula is midline and oropharynx is clear and moist. Mucous membranes are not pale, dry and not cyanotic. No trismus in the jaw. Abnormal dentition. Dental caries present. No uvula swelling. No oropharyngeal exudate, posterior oropharyngeal edema, posterior oropharyngeal erythema or tonsillar abscesses.    Eyes: Conjunctivae and EOM are normal. Pupils are equal, round, and reactive to light. Right eye exhibits no discharge. Left eye exhibits no discharge. No scleral icterus.  Neck: Normal range of motion. Neck supple. No JVD present. No tracheal deviation present. No thyromegaly  present.  Cardiovascular: Normal rate, regular rhythm and normal heart sounds.   Pulmonary/Chest: Effort normal and breath sounds normal. No stridor. No respiratory distress. He has no wheezes. He has no rales.  Abdominal: Soft. Bowel sounds are normal. He exhibits no distension. There is no tenderness.  Musculoskeletal: Normal range of motion. He exhibits no edema or tenderness.   Lymphadenopathy:    He has no cervical adenopathy.  Neurological: He is alert and oriented to person, place, and time. He has normal reflexes. No cranial nerve deficit. He exhibits normal muscle tone. Coordination normal.  Skin: Skin is warm and dry. No rash noted. He is not diaphoretic. No erythema. No pallor.  Psychiatric: He has a normal mood and affect. His behavior is normal. Judgment and thought content normal.  Nursing note and vitals reviewed.   ED Course  Procedures  DIAGNOSTIC STUDIES:    Oxygen Saturation is 98% on RA, normal by my interpretation.   COORDINATION OF CARE:  1:45 PM Will further assess pt with use of ultra-sound. Will administer pt Zofran and pain medication in the ED. Will prescribe pt antibiotics. Discussed treatment plan with pt at bedside and pt agreed to plan.   Imaging Review No results found. I have personally reviewed and evaluated these lab results as part of my medical decision-making.   MDM   Patient with toothache.  Right lower jaw swelling, without palpable area of fluctuance.  Pain and swelling x 1d, without erythema of fever. Exam unconcerning for Ludwig's angina or spread of infection.  Will treat with penicillin and pain medicine.  Urged patient to follow-up with dentist.    Final diagnoses:  Periapical abscess   Patient presents with significant swelling but no concern for Ludwig. He is unable to take narcotic pain medicine while in the ER due to driving himself here he requested Tylenol instead. He is dentist is out of town until after [employer] which is approximately 2 weeks away.  Return precautions were reviewed with the patient who verbalizes understanding. I contacted Dr. Norris Cross office who is on-call dental provider. An appointment was made for Joshua Kramer on 01/08/2015 at 10 AM, for further follow-up. Joshua Kramer will call them in the morning for further details.  He was discharged in stable condition with normal vital  signs    Danelle Berry, PA-C 01/06/15 1822  Benjiman Core, MD 01/07/15 2235

## 2015-01-22 ENCOUNTER — Telehealth: Payer: Self-pay | Admitting: Family Medicine

## 2015-01-22 MED ORDER — HYDROCODONE-ACETAMINOPHEN 7.5-325 MG PO TABS: 1.0000 | ORAL_TABLET | Freq: Four times a day (QID) | ORAL | Status: DC | PRN

## 2015-01-22 NOTE — Telephone Encounter (Signed)
RX printed x 3 months, left up front and patient aware to pick up tomorrow am via vm

## 2015-01-22 NOTE — Telephone Encounter (Signed)
?   OK to Refill  

## 2015-01-22 NOTE — Telephone Encounter (Signed)
Patient calling to get rx for his hydrocodone  [phone]

## 2015-01-22 NOTE — Telephone Encounter (Signed)
ok 

## 2015-03-03 ENCOUNTER — Encounter: Payer: Self-pay | Admitting: Family Medicine

## 2015-04-21 ENCOUNTER — Telehealth: Payer: Self-pay | Admitting: Family Medicine

## 2015-04-21 MED ORDER — HYDROCODONE-ACETAMINOPHEN 7.5-325 MG PO TABS: 1.0000 | ORAL_TABLET | Freq: Four times a day (QID) | ORAL | Status: DC | PRN

## 2015-04-21 NOTE — Telephone Encounter (Signed)
?   OK to Refill  

## 2015-04-21 NOTE — Telephone Encounter (Signed)
Okay for one month. Patient was last seen in October. Must be seen every 6 months for chronic narcotics

## 2015-04-21 NOTE — Telephone Encounter (Signed)
Pt is calling for a refill of Hydrocodone 7.5-325. He is hoping that Dr. Tanya NonesPickard can write him for 3 months to help save him the drive.  [phone]

## 2015-04-22 NOTE — Telephone Encounter (Signed)
RX printed, left up front and patient aware to pick up and to schedule an appt.

## 2015-05-18 DIAGNOSIS — R079 Chest pain, unspecified: Secondary | ICD-10-CM

## 2015-05-18 HISTORY — DX: Chest pain, unspecified: R07.9

## 2015-05-29 ENCOUNTER — Encounter: Payer: Self-pay | Admitting: Family Medicine

## 2015-05-29 ENCOUNTER — Ambulatory Visit (INDEPENDENT_AMBULATORY_CARE_PROVIDER_SITE_OTHER): Payer: Self-pay | Admitting: Family Medicine

## 2015-05-29 VITALS — BP 136/76 | HR 68 | Temp 98.2°F | Resp 16 | Ht 73.0 in | Wt 236.0 lb

## 2015-05-29 DIAGNOSIS — M459 Ankylosing spondylitis of unspecified sites in spine: Secondary | ICD-10-CM

## 2015-05-29 DIAGNOSIS — M549 Dorsalgia, unspecified: Secondary | ICD-10-CM

## 2015-05-29 DIAGNOSIS — G8929 Other chronic pain: Secondary | ICD-10-CM

## 2015-05-29 MED ORDER — HYDROCODONE-ACETAMINOPHEN 7.5-325 MG PO TABS: 1.0000 | ORAL_TABLET | Freq: Four times a day (QID) | ORAL | Status: DC | PRN

## 2015-05-29 NOTE — Progress Notes (Signed)
Subjective:    Patient ID: Joshua Kramer, male    DOB: 01-Aug-1955, 60 y.o.   MRN: [id number]  HPI 10/30/14 Patient has a history of ankylosing spondylitis. He is tried numerous biologic agents to prevent exacerbations of this under the care of a rheumatologist. He failed many of them either due to cost or secondary infections. He was unable to tolerate enbrel or humira.  He tried sulfasalazine in the past without any benefit. He is currently using Norco 7.5/325 one 4 times a day. He states that his pain is getting worse. He continues to have severe pain in his lower back, in his hands, and in his knees. He has very little range of motion in his neck. His neck is essentially fused. He must turn his entire torso to look laterally. He has inflammatory swelling in the DIP joint on his right fourth digit.  He is interested in increasing the frequency of his pain medication.  AT that time, my plan was: I am fine refilling the patient's Norco. He can continue to take this 4 times a day. I will give him 120 tablets with 2 refills. Patient has no insurance and therefore he cannot afford the biologic agents. He cannot afford lab work. However I insisted that we get a CMP to monitor his kidney/liver function and blood sugar. I will also start the patient on prednisone 10 mg a day empirically for one month to see if his quality of life improves. If it does, we may want to consider continuing this long-term and then have a discussion regarding the side effects that prolonged glucocorticoid use can cause.  05/29/15 Patient is here today requesting a refill on his pain medication. There is no evidence of abuse or diversion. Unfortunately the patient still does not have any health insurance. Therefore he still has not had a colonoscopy. He is overdue for a digital rectal exam and PSA. He is also overdue for fasting lipid panel. Based on his age, he would also qualify for hepatitis C screening. Based on his age he will be  due for the shingles vaccine. I have recommended all these things to the patient today. Obviously cost is a concern. He is interested in applying for disability and possibly Medicare/Medicaid. We had a discussion today about the process for doing this. Past Medical History  Diagnosis Date  . Ankylosing spondylitis of multiple sites in spine (HCC)   . Smoker    No past surgical history on file. Current Outpatient Prescriptions on File Prior to Visit  Medication Sig Dispense Refill  . HYDROcodone-acetaminophen (NORCO) 7.5-325 MG tablet Take 1 tablet by mouth every 6 (six) hours as needed. Do not fill until 03/24/15 120 tablet 0   No current facility-administered medications on file prior to visit.   No Known Allergies Social History   Social History  . Marital Status: Married    Spouse Name: N/A  . Number of Children: N/A  . Years of Education: N/A   Occupational History  . Not on file.   Social History Main Topics  . Smoking status: Former Smoker    Quit date: 06/18/2014  . Smokeless tobacco: Never Used  . Alcohol Use: No  . Drug Use: No  . Sexual Activity: Yes   Other Topics Concern  . Not on file   Social History Narrative      Review of Systems  All other systems reviewed and are negative.      Objective:   Physical Exam  Constitutional: He appears well-developed and well-nourished.  Cardiovascular: Normal rate, regular rhythm and normal heart sounds.   Pulmonary/Chest: Effort normal and breath sounds normal.  Musculoskeletal:       Right knee: He exhibits decreased range of motion. Tenderness found. Medial joint line and lateral joint line tenderness noted.       Cervical back: He exhibits decreased range of motion and tenderness.       Thoracic back: He exhibits decreased range of motion and tenderness.       Right hand: He exhibits decreased range of motion, tenderness and bony tenderness.  Vitals reviewed.         Assessment & Plan:  Ankylosing  spondylitis (HCC)  Chronic back pain  I refilled the patient's pain medication for a total of 3 months. There is no evidence of abuse or diversion. I recommended a colonoscopy, I recommended a PSA. I recommended a fasting lipid panel. I recommended a shingles vaccine. I recommended hepatitis C screening. The patient believes he will come back to have his cholesterol checked. I am fine with that. He would like to do this one of the time to make it more affordable which I certainly understand. He is also going to inquire about disability with social services

## 2015-05-31 ENCOUNTER — Encounter (HOSPITAL_COMMUNITY): Payer: Self-pay

## 2015-05-31 ENCOUNTER — Observation Stay (HOSPITAL_COMMUNITY)
Admission: EM | Admit: 2015-05-31 | Discharge: 2015-06-02 | Disposition: A | Discharge status: Home / Self Care | Payer: Self-pay | Attending: Cardiology | Admitting: Cardiology

## 2015-05-31 ENCOUNTER — Emergency Department (HOSPITAL_COMMUNITY): Payer: Self-pay

## 2015-05-31 DIAGNOSIS — R030 Elevated blood-pressure reading, without diagnosis of hypertension: Secondary | ICD-10-CM | POA: Insufficient documentation

## 2015-05-31 DIAGNOSIS — I472 Ventricular tachycardia: Secondary | ICD-10-CM | POA: Insufficient documentation

## 2015-05-31 DIAGNOSIS — I440 Atrioventricular block, first degree: Secondary | ICD-10-CM | POA: Insufficient documentation

## 2015-05-31 DIAGNOSIS — I4510 Unspecified right bundle-branch block: Secondary | ICD-10-CM | POA: Insufficient documentation

## 2015-05-31 DIAGNOSIS — Z87891 Personal history of nicotine dependence: Secondary | ICD-10-CM | POA: Insufficient documentation

## 2015-05-31 DIAGNOSIS — R001 Bradycardia, unspecified: Secondary | ICD-10-CM

## 2015-05-31 DIAGNOSIS — I4729 Other ventricular tachycardia: Secondary | ICD-10-CM

## 2015-05-31 DIAGNOSIS — R079 Chest pain, unspecified: Principal | ICD-10-CM | POA: Diagnosis present

## 2015-05-31 DIAGNOSIS — IMO0001 Reserved for inherently not codable concepts without codable children: Secondary | ICD-10-CM

## 2015-05-31 DIAGNOSIS — R0602 Shortness of breath: Secondary | ICD-10-CM | POA: Insufficient documentation

## 2015-05-31 DIAGNOSIS — E785 Hyperlipidemia, unspecified: Secondary | ICD-10-CM

## 2015-05-31 DIAGNOSIS — R9439 Abnormal result of other cardiovascular function study: Secondary | ICD-10-CM | POA: Insufficient documentation

## 2015-05-31 DIAGNOSIS — M459 Ankylosing spondylitis of unspecified sites in spine: Secondary | ICD-10-CM | POA: Diagnosis present

## 2015-05-31 HISTORY — DX: Personal history of nicotine dependence: Z87.891

## 2015-05-31 HISTORY — DX: Chest pain, unspecified: R07.9

## 2015-05-31 LAB — CBC
HEMATOCRIT: 44.3 % (ref 39.0–52.0)
HEMOGLOBIN: 14.9 g/dL (ref 13.0–17.0)
MCH: 29.3 pg (ref 26.0–34.0)
MCHC: 33.6 g/dL (ref 30.0–36.0)
MCV: 87.2 fL (ref 78.0–100.0)
Platelets: 264 10*3/uL (ref 150–400)
RBC: 5.08 MIL/uL (ref 4.22–5.81)
RDW: 13.6 % (ref 11.5–15.5)
WBC: 13.7 10*3/uL — ABNORMAL HIGH (ref 4.0–10.5)

## 2015-05-31 LAB — BASIC METABOLIC PANEL
ANION GAP: 10 (ref 5–15)
BUN: 19 mg/dL (ref 6–20)
CHLORIDE: 105 mmol/L (ref 101–111)
CO2: 23 mmol/L (ref 22–32)
Calcium: 9.4 mg/dL (ref 8.9–10.3)
Creatinine, Ser: 0.77 mg/dL (ref 0.61–1.24)
GFR calc non Af Amer: >60 mL/min (ref >60)
GLUCOSE: 161 mg/dL — AB (ref 65–99)
Potassium: 4.2 mmol/L (ref 3.5–5.1)
Sodium: 138 mmol/L (ref 135–145)

## 2015-05-31 LAB — HEPATIC FUNCTION PANEL
ALBUMIN: 4 g/dL (ref 3.5–5.0)
ALK PHOS: 73 U/L (ref 38–126)
ALT: 17 U/L (ref 17–63)
AST: 15 U/L (ref 15–41)
BILIRUBIN TOTAL: 1.1 mg/dL (ref 0.3–1.2)
Bilirubin, Direct: <0.1 mg/dL — ABNORMAL LOW (ref 0.1–0.5)
TOTAL PROTEIN: 7.8 g/dL (ref 6.5–8.1)

## 2015-05-31 LAB — LIPASE, BLOOD: Lipase: 23 U/L (ref 11–51)

## 2015-05-31 LAB — I-STAT TROPONIN, ED: TROPONIN I, POC: 0 ng/mL (ref 0.00–0.08)

## 2015-05-31 MED ORDER — NITROGLYCERIN 0.4 MG SL SUBL
0.4000 mg | SUBLINGUAL_TABLET | SUBLINGUAL | Status: AC | PRN
  Administered 2015-05-31 – 2015-06-01 (×3): 0.4 mg via SUBLINGUAL
  Filled 2015-05-31 (×2): qty 1

## 2015-05-31 MED ORDER — MORPHINE SULFATE (PF) 4 MG/ML IV SOLN
4.0000 mg | Freq: Once | INTRAVENOUS | Status: AC
  Administered 2015-05-31: 4 mg via INTRAVENOUS
  Filled 2015-05-31: qty 1

## 2015-05-31 MED ORDER — ASPIRIN 81 MG PO CHEW
324.0000 mg | CHEWABLE_TABLET | Freq: Once | ORAL | Status: AC
  Administered 2015-05-31: 324 mg via ORAL
  Filled 2015-05-31: qty 4

## 2015-05-31 MED ORDER — SODIUM CHLORIDE 0.9 % IV SOLN
INTRAVENOUS | Status: DC
  Administered 2015-05-31 – 2015-06-01 (×3): via INTRAVENOUS

## 2015-05-31 MED ORDER — NITROGLYCERIN 2 % TD OINT
1.0000 [in_us] | TOPICAL_OINTMENT | Freq: Once | TRANSDERMAL | Status: AC
  Administered 2015-05-31: 1 [in_us] via TOPICAL
  Filled 2015-05-31: qty 1

## 2015-05-31 MED ORDER — PANTOPRAZOLE SODIUM 40 MG IV SOLR
40.0000 mg | Freq: Once | INTRAVENOUS | Status: AC
  Administered 2015-05-31: 40 mg via INTRAVENOUS
  Filled 2015-05-31: qty 40

## 2015-05-31 NOTE — ED Notes (Signed)
Attempted to call report

## 2015-05-31 NOTE — ED Notes (Signed)
Pt here for chest pain, onset today while at rest, pt appears tired and pale,

## 2015-05-31 NOTE — ED Provider Notes (Signed)
CSN: [id number]     Arrival date & time 05/31/15  1954 History   First MD Initiated Contact with Patient 05/31/15 2039     Chief Complaint  Patient presents with  . Chest Pain     (Consider location/radiation/quality/duration/timing/severity/associated sxs/prior Treatment) HPI Chest pain started approximately 2 hours prior to arrival. Onset at rest. At onset, mild to moderate and escalating to severe. Pain in the central chest. Deep aching and sharp pain. Positive associated shortness of breath, nausea and weakness. Patient has no prior history of cardiac disease. No history of hypertension. Never similar chest pain. No radiation to back, neck or arms. Patient's only medical history is severe ankylosing spondylitis. He reports occasional use of NSAIDs but not regular. No history of peptic ulcer disease. No lower extremity swelling or calf pain. No history of DVT. Family history negative for early onset coronary artery disease. Past Medical History  Diagnosis Date  . Ankylosing spondylitis of multiple sites in spine (HCC)   . Smoker    History reviewed. No pertinent past surgical history. Family History  Problem Relation Age of Onset  . Arthritis Mother    Social History  Substance Use Topics  . Smoking status: Former Smoker    Quit date: 06/18/2014  . Smokeless tobacco: Never Used  . Alcohol Use: No    Review of Systems 10 Systems reviewed and are negative for acute change except as noted in the HPI.    Allergies  Review of patient's allergies indicates no known allergies.  Home Medications   Prior to Admission medications   Medication Sig Start Date End Date Taking? Authorizing Provider  HYDROcodone-acetaminophen (NORCO) 7.5-325 MG tablet Take 1 tablet by mouth every 6 (six) hours as needed. July 2017 RX 06/26/15   Donita Brooks, MD   BP 128/71 mmHg  Pulse 45  Temp(Src) 97.6 F (36.4 C) (Oral)  Resp 13  Ht 6' (1.829 m)  Wt 230 lb (104.327 kg)  BMI 31.19 kg/m2   SpO2 99% Physical Exam  Constitutional: He is oriented to person, place, and time. He appears well-developed and well-nourished.  Patient is pale and appears to be in severe pain. No respiratory distress. Normal mental status.  HENT:  Head: Normocephalic and atraumatic.  Mouth/Throat: Oropharynx is clear and moist.  Eyes: EOM are normal. Pupils are equal, round, and reactive to light.  Neck: Neck supple.  Cardiovascular: Regular rhythm, normal heart sounds and intact distal pulses.   Bradycardia  Pulmonary/Chest: Effort normal and breath sounds normal. No respiratory distress. He has no wheezes.  Abdominal: Soft. Bowel sounds are normal. He exhibits no distension. There is no tenderness.  Musculoskeletal: He exhibits no edema or tenderness.  Neurological: He is alert and oriented to person, place, and time. He exhibits normal muscle tone. Coordination normal.  Skin: There is pallor.  Mildly diaphoretic  Psychiatric: He has a normal mood and affect.    ED Course  Procedures (including critical care time) Labs Review Labs Reviewed  BASIC METABOLIC PANEL - Abnormal; Notable for the following:    Glucose, Bld 161 (*)    All other components within normal limits  CBC - Abnormal; Notable for the following:    WBC 13.7 (*)    All other components within normal limits  HEPATIC FUNCTION PANEL - Abnormal; Notable for the following:    Bilirubin, Direct <0.1 (*)    All other components within normal limits  LIPASE, BLOOD  I-STAT TROPOININ, ED    Imaging Review Dg  Chest 2 View  05/31/2015  CLINICAL DATA:  Initial valuation for acute central chest pain. EXAM: CHEST  2 VIEW COMPARISON:  Prior study from 06/11/2010 FINDINGS: The cardiac and mediastinal silhouettes are stable in size and contour, and remain within normal limits. The lungs are normally inflated. Mild diffuse bronchitic changes, similar to prior. No airspace consolidation, pleural effusion, or pulmonary edema is identified.  There is no pneumothorax. No acute osseous abnormality identified. IMPRESSION: 1. No active cardiopulmonary disease. 2. Mild diffuse bronchitic changes, similar to prior. Electronically Signed   By: Rise MuBenjamin  McClintock M.D.   On: 05/31/2015 22:05   I have personally reviewed and evaluated these images and lab results as part of my medical decision-making.   EKG Interpretation   Date/Time:  Sunday May 31 2015 20:02:24 EDT Ventricular Rate:  56 PR Interval:  248 QRS Duration: 152 QT Interval:  462 QTC Calculation: 445 R Axis:   94 Text Interpretation:  Sinus bradycardia with 1st degree A-V block Right  bundle branch block Abnormal ECG agree. more pronounced RBB, peaked t  wave. no STEMI Confirmed by Donnald GarrePfeiffer, MD, Lebron ConnersMarcy [phone]([id number]) on 05/31/2015  8:05:15 PM     Consult: Cardiology Dr. Sullivan LoneGilbert (21:25) as reviewed EKG and at this time no MI pattern present with right bundle branch block. Requests administration of nitroglycerin and will see patient in the emergency department. MDM   Final diagnoses:  Chest pain, unspecified chest pain type   Patient had acute onset of chest pain that rapidly worsened. This was central with associated shortness of breath, nausea and diaphoresis. EKG does not show a STEMI. Cardiac enzymes are not elevated. She has no associated abdominal pain. He has not had cough, fever or other pulmonary symptoms. At this time, patient will be admitted for continued rule out of MI. Cardiology has consulted and seen the patient in the emergency department.    Arby BarretteMarcy Huy Majid, MD 05/31/15 [phone]

## 2015-05-31 NOTE — ED Notes (Signed)
MD at bedside. 

## 2015-05-31 NOTE — H&P (Addendum)
History & Physical    Patient ID: Joshua Kramer MRN: [id number], DOB/AGE: January 02, 1956  Admit date: 05/31/2015 Primary Physician: Leo Grosser, MD  CC:  Chest pain  HPI    60 yo M w a h/o ankylosing spondylitis, prior tobacco abuse (quit 06/18/14), & hyperlipidemia presented this evening at 20:00 with a 1 hour episode of 10/10 chest pain that started while he was lying in bed.  He had associated diaphoresis, dyspnea, & lightheadedness.  He denied association of the pain with activity, oral intake, deep breaths, or position.  His ECG revealed a RBBB with sinus bradycardia & first degree AVB (progressed from his nonspecific intraventricular conduction block back in 2011)  With Morphine 4 mg given in the ED, his pain improved to a 8/10.  With NTG x 1, improved to a 5/10.  With a second NTG, improved to a 1/10.  He was also given ASA 324.    He had similar but mild pain back in 2011 when he underwent a cardiac catheterization that was unremarkable.  A TTE from that time revealed an EF of 55-60% with grade I diastolic dysfunction.  Since that time, he has not had further chest pain.  At baseline, his physical activity is somewhat limited by his ankylosing spondylitis as his neck is "90% fused."   Past Medical History   Past Medical History  Diagnosis Date  . Ankylosing spondylitis of multiple sites in spine (HCC)   . Smoker     History reviewed. No pertinent past surgical history.   Allergies  No Known Allergies  Home Medications    Prior to Admission medications   Medication Sig Start Date End Date Taking? Authorizing Provider  HYDROcodone-acetaminophen (NORCO) 7.5-325 MG tablet Take 1 tablet by mouth every 6 (six) hours as needed. July 2017 RX 06/26/15   Donita Brooks, MD   Family History    Family History  Problem Relation Age of Onset  . Arthritis Mother    Social History    Social History   Social History  . Marital Status: Married    Spouse Name: N/A  . Number of  Children: 1  . Years of Education: N/A   Occupational History  . Sales    Social History Main Topics  . Smoking status: Former Smoker    Quit date: 06/18/2014  . Smokeless tobacco: Never Used  . Alcohol Use: No  . Drug Use: No  . Sexual Activity: Yes   Other Topics Concern  . Not on file   Social History Narrative    Review of Systems    General:  No chills, fever, night sweats or weight changes.  Cardiovascular:  Positive for chest pain, diaphoresis, & lightheadedness.  No dyspnea on exertion, edema, orthopnea, palpitations, paroxysmal nocturnal dyspnea. Dermatological: No rash, lesions/masses Respiratory: Positive for dyspnea.  No cough. Urologic: No hematuria, dysuria Abdominal:   No nausea, vomiting, diarrhea, bright red blood per rectum, melena, or hematemesis Neurologic:  No visual changes, wkns, changes in mental status. All other systems reviewed and are otherwise negative except as noted above.  Physical Exam    Blood pressure 128/71, pulse 45, temperature 97.6 F (36.4 C), temperature source Oral, resp. rate 13, height 6' (1.829 m), weight 104.327 kg (230 lb), SpO2 99 %.  General: Pleasant, NAD, mildly diaphoretic  Psych: Normal affect. Neuro: Alert and oriented X 3. Moves all extremities spontaneously. HEENT: Normal  Neck: Supple without bruits or JVD. Lungs:  Resp regular and unlabored, CTA. Heart: RRR  no s3, s4, or murmurs.  No chest wall tenderness. Abdomen: Soft, non-tender, non-distended, BS + x 4.  Extremities: No clubbing, cyanosis or edema. DP/PT/Radials 2+ and equal bilaterally.  Labs    Troponin [facility](Point of Care Test)  Recent Labs  05/31/15 2018  TROPIPOC 0.00   No results for input(s): CKTOTAL, CKMB, TROPONINI in the last 72 hours. Lab Results  Component Value Date   WBC 13.7* 05/31/2015   HGB 14.9 05/31/2015   HCT 44.3 05/31/2015   MCV 87.2 05/31/2015   PLT 264 05/31/2015    Recent Labs Lab 05/31/15 2013  NA 138  K 4.2  CL 105    CO2 23  BUN 19  CREATININE 0.77  CALCIUM 9.4  PROT 7.8  BILITOT 1.1  ALKPHOS 73  ALT 17  AST 15  GLUCOSE 161*   Lab Results  Component Value Date   CHOL  04/09/2009    177        ATP III CLASSIFICATION:  <200     mg/dL   Desirable  147-829200-239  mg/dL   Borderline High  >=562>=240    mg/dL   High          HDL 27* 04/09/2009   LDLCALC * 04/09/2009    107        Total Cholesterol/HDL:CHD Risk Coronary Heart Disease Risk Table                     Men   Women  1/2 Average Risk   3.4   3.3  Average Risk       5.0   4.4  2 X Average Risk   9.6   7.1  3 X Average Risk  23.4   11.0        Use the calculated Patient Ratio above and the CHD Risk Table to determine the patient's CHD Risk.        ATP III CLASSIFICATION (LDL):  <100     mg/dL   Optimal  130-865100-129  mg/dL   Near or Above                    Optimal  130-159  mg/dL   Borderline  [id number]  mg/dL   High  >295>190     mg/dL   Very High   TRIG 284213* 04/09/2009   No results found for: [facility]   Radiology Studies    Dg Chest 2 View  05/31/2015  CLINICAL DATA:  Initial valuation for acute central chest pain. EXAM: CHEST  2 VIEW COMPARISON:  Prior study from 06/11/2010 FINDINGS: The cardiac and mediastinal silhouettes are stable in size and contour, and remain within normal limits. The lungs are normally inflated. Mild diffuse bronchitic changes, similar to prior. No airspace consolidation, pleural effusion, or pulmonary edema is identified. There is no pneumothorax. No acute osseous abnormality identified. IMPRESSION: 1. No active cardiopulmonary disease. 2. Mild diffuse bronchitic changes, similar to prior. Electronically Signed   By: Rise MuBenjamin  McClintock M.D.   On: 05/31/2015 22:05   Assessment & Plan    60 yo M w a h/o ankylosing spondylitis, prior tobacco abuse (quit 06/18/14), & prior hyperlipidemia presented with typical angina in that it responded to NTG.    # Chest pain - There are no overt ischemic changes on his ECG.  His first  troponin was negative.  Due to the sinus bradycardia, a right-sided ECG was checked (10:04 pm) & was negative for RV infarct.  His risk factors for CAD include his age, tobacco history, & hyperlipidemia. - Given his response to NTG in the ED, he was transitioned to a NTG patch. - In the meantime, will continue ASA.  Will also give Atorvastatin. - No BB with bradycardia. - Monitor on telemetry with serial cardiac enzymes. - Check a HgbA1c & fasting lipid panel.   - Depending on his status overnight, including trend of troponin, we will decide invasive versus non-invasive strategies for ischemic workup.  Will make NPO in case & order a resting TTE in the meantime.    # Sinus bradycardia - This is more prominent this evening that it seems to have previously been (though he has always tended to low in the high 50's to low 70's).  The patient's wife felt that this had been a chronic issue for him.   - Will check a TSH & continue to monitor on telemetry.  # Blood pressure elevated on presentation - This may have been related to his pain - Will continue to monitor.  # h/o HLD - He is not currently receiving treatment for this, though he was on his 2011 discharge summary. - Check a level as per above.    # h/o Ankylosing spondylitis - Will make his home pain medications available.    # PPX - Lovenox  # Full Code  Signed, Julaine Hua, MD 05/31/2015, 11:04 PM

## 2015-06-01 ENCOUNTER — Observation Stay (HOSPITAL_BASED_OUTPATIENT_CLINIC_OR_DEPARTMENT_OTHER): Payer: Self-pay

## 2015-06-01 ENCOUNTER — Observation Stay (HOSPITAL_COMMUNITY): Payer: Self-pay

## 2015-06-01 ENCOUNTER — Encounter (HOSPITAL_COMMUNITY): Payer: Self-pay | Admitting: Physician Assistant

## 2015-06-01 DIAGNOSIS — R079 Chest pain, unspecified: Secondary | ICD-10-CM

## 2015-06-01 LAB — COMPREHENSIVE METABOLIC PANEL
ALBUMIN: 3.3 g/dL — AB (ref 3.5–5.0)
ALT: 14 U/L — AB (ref 17–63)
ANION GAP: 6 (ref 5–15)
AST: 12 U/L — AB (ref 15–41)
Alkaline Phosphatase: 61 U/L (ref 38–126)
BILIRUBIN TOTAL: 0.7 mg/dL (ref 0.3–1.2)
BUN: 17 mg/dL (ref 6–20)
CALCIUM: 8.6 mg/dL — AB (ref 8.9–10.3)
CO2: 24 mmol/L (ref 22–32)
Chloride: 107 mmol/L (ref 101–111)
Creatinine, Ser: 0.61 mg/dL (ref 0.61–1.24)
GFR calc Af Amer: >60 mL/min (ref >60)
GLUCOSE: 107 mg/dL — AB (ref 65–99)
Potassium: 3.6 mmol/L (ref 3.5–5.1)
Sodium: 137 mmol/L (ref 135–145)
TOTAL PROTEIN: 6.5 g/dL (ref 6.5–8.1)

## 2015-06-01 LAB — CBC
HCT: 38.9 % — ABNORMAL LOW (ref 39.0–52.0)
HEMATOCRIT: 41.8 % (ref 39.0–52.0)
HEMOGLOBIN: 13.9 g/dL (ref 13.0–17.0)
Hemoglobin: 13 g/dL (ref 13.0–17.0)
MCH: 29.1 pg (ref 26.0–34.0)
MCH: 29.1 pg (ref 26.0–34.0)
MCHC: 33.3 g/dL (ref 30.0–36.0)
MCHC: 33.4 g/dL (ref 30.0–36.0)
MCV: 87 fL (ref 78.0–100.0)
MCV: 87.4 fL (ref 78.0–100.0)
PLATELETS: 224 10*3/uL (ref 150–400)
Platelets: 248 10*3/uL (ref 150–400)
RBC: 4.47 MIL/uL (ref 4.22–5.81)
RBC: 4.78 MIL/uL (ref 4.22–5.81)
RDW: 13.7 % (ref 11.5–15.5)
RDW: 13.8 % (ref 11.5–15.5)
WBC: 11.5 10*3/uL — AB (ref 4.0–10.5)
WBC: 12.4 10*3/uL — AB (ref 4.0–10.5)

## 2015-06-01 LAB — LIPID PANEL
Cholesterol: 185 mg/dL (ref 0–200)
HDL: 32 mg/dL — AB (ref >40)
LDL CALC: 142 mg/dL — AB (ref 0–99)
TRIGLYCERIDES: 57 mg/dL (ref <150)
Total CHOL/HDL Ratio: 5.8 RATIO
VLDL: 11 mg/dL (ref 0–40)

## 2015-06-01 LAB — TSH: TSH: 1.588 u[IU]/mL (ref 0.350–4.500)

## 2015-06-01 LAB — MAGNESIUM: MAGNESIUM: 2 mg/dL (ref 1.7–2.4)

## 2015-06-01 LAB — ECHOCARDIOGRAM COMPLETE
Height: 72 in
Weight: 3680 oz

## 2015-06-01 LAB — D-DIMER, QUANTITATIVE: D-Dimer, Quant: 0.37 ug/mL-FEU (ref 0.00–0.50)

## 2015-06-01 LAB — CREATININE, SERUM: Creatinine, Ser: 0.84 mg/dL (ref 0.61–1.24)

## 2015-06-01 LAB — TROPONIN I
Troponin I: <0.03 ng/mL (ref <0.031)
Troponin I: <0.03 ng/mL (ref <0.031)

## 2015-06-01 LAB — PROTIME-INR
INR: 1.07 (ref 0.00–1.49)
PROTHROMBIN TIME: 14.1 s (ref 11.6–15.2)

## 2015-06-01 MED ORDER — ATORVASTATIN CALCIUM 80 MG PO TABS
80.0000 mg | ORAL_TABLET | Freq: Every day | ORAL | Status: DC
  Administered 2015-06-01 – 2015-06-02 (×2): 80 mg via ORAL
  Filled 2015-06-01 (×3): qty 1

## 2015-06-01 MED ORDER — ENOXAPARIN SODIUM 60 MG/0.6ML ~~LOC~~ SOLN: 50.0000 mg | SUBCUTANEOUS | Status: DC

## 2015-06-01 MED ORDER — TECHNETIUM TC 99M TETROFOSMIN IV KIT
30.0000 | PACK | Freq: Once | INTRAVENOUS | Status: AC | PRN
  Administered 2015-06-01: 30 via INTRAVENOUS

## 2015-06-01 MED ORDER — NITROGLYCERIN 2 % TD OINT
1.0000 [in_us] | TOPICAL_OINTMENT | Freq: Four times a day (QID) | TRANSDERMAL | Status: DC
  Administered 2015-06-01 (×2): 1 [in_us] via TOPICAL
  Filled 2015-06-01: qty 30

## 2015-06-01 MED ORDER — ACETAMINOPHEN 325 MG PO TABS: 650.0000 mg | ORAL_TABLET | ORAL | Status: DC | PRN

## 2015-06-01 MED ORDER — REGADENOSON 0.4 MG/5ML IV SOLN
0.4000 mg | Freq: Once | INTRAVENOUS | Status: AC
  Administered 2015-06-01: 0.4 mg via INTRAVENOUS
  Filled 2015-06-01: qty 5

## 2015-06-01 MED ORDER — SODIUM CHLORIDE 0.9% FLUSH
3.0000 mL | Freq: Two times a day (BID) | INTRAVENOUS | Status: DC
  Administered 2015-06-02: 3 mL via INTRAVENOUS

## 2015-06-01 MED ORDER — ALPRAZOLAM 0.25 MG PO TABS
0.2500 mg | ORAL_TABLET | Freq: Two times a day (BID) | ORAL | Status: DC | PRN
  Administered 2015-06-01 – 2015-06-02 (×2): 0.25 mg via ORAL
  Filled 2015-06-01 (×2): qty 1

## 2015-06-01 MED ORDER — ENOXAPARIN SODIUM 40 MG/0.4ML ~~LOC~~ SOLN
40.0000 mg | Freq: Every day | SUBCUTANEOUS | Status: DC
  Administered 2015-06-01: 40 mg via SUBCUTANEOUS
  Filled 2015-06-01: qty 0.4

## 2015-06-01 MED ORDER — ASPIRIN EC 325 MG PO TBEC
325.0000 mg | DELAYED_RELEASE_TABLET | Freq: Every day | ORAL | Status: DC
  Administered 2015-06-01 – 2015-06-02 (×2): 325 mg via ORAL
  Filled 2015-06-01 (×2): qty 1

## 2015-06-01 MED ORDER — TECHNETIUM TC 99M TETROFOSMIN IV KIT
10.0000 | PACK | Freq: Once | INTRAVENOUS | Status: AC | PRN
  Administered 2015-06-01: 10 via INTRAVENOUS

## 2015-06-01 MED ORDER — ONDANSETRON HCL 4 MG/2ML IJ SOLN: 4.0000 mg | Freq: Four times a day (QID) | INTRAMUSCULAR | Status: DC | PRN

## 2015-06-01 MED ORDER — ZOLPIDEM TARTRATE 5 MG PO TABS
5.0000 mg | ORAL_TABLET | Freq: Every evening | ORAL | Status: DC | PRN
  Administered 2015-06-01: 5 mg via ORAL
  Filled 2015-06-01: qty 1

## 2015-06-01 MED ORDER — NITROGLYCERIN 2 % TD OINT
0.5000 [in_us] | TOPICAL_OINTMENT | Freq: Four times a day (QID) | TRANSDERMAL | Status: DC
  Administered 2015-06-01 – 2015-06-02 (×4): 0.5 [in_us] via TOPICAL
  Filled 2015-06-01: qty 30

## 2015-06-01 MED ORDER — REGADENOSON 0.4 MG/5ML IV SOLN
INTRAVENOUS | Status: AC
  Filled 2015-06-01: qty 5

## 2015-06-01 MED ORDER — NITROGLYCERIN 0.4 MG SL SUBL: 0.4000 mg | SUBLINGUAL_TABLET | SUBLINGUAL | Status: DC | PRN

## 2015-06-01 MED ORDER — HYDROCODONE-ACETAMINOPHEN 7.5-325 MG PO TABS
1.0000 | ORAL_TABLET | Freq: Four times a day (QID) | ORAL | Status: DC | PRN
  Administered 2015-06-01 – 2015-06-02 (×3): 1 via ORAL
  Filled 2015-06-01 (×3): qty 1

## 2015-06-01 MED ORDER — GI COCKTAIL ~~LOC~~
30.0000 mL | Freq: Two times a day (BID) | ORAL | Status: DC | PRN
  Administered 2015-06-01: 30 mL via ORAL
  Filled 2015-06-01: qty 30

## 2015-06-01 NOTE — Progress Notes (Addendum)
Subjective: Mild CP continues.  Worse with a deep breath.   Objective: Vital signs in last 24 hours: Temp:  [97.6 F (36.4 C)-98.2 F (36.8 C)] 98.2 F (36.8 C) (05/15 1447) Pulse Rate:  [41-66] 60 (05/15 1447) Resp:  [11-20] 18 (05/15 1308) BP: (122-165)/(57-93) 142/63 mmHg (05/15 1447) SpO2:  [97 %-100 %] 99 % (05/15 1447) Weight:  [230 lb (104.327 kg)] 230 lb (104.327 kg) (05/14 2006) Last BM Date: 05/31/15  Intake/Output from previous day:   Intake/Output this shift: Total I/O In: 1379.2 [I.V.:1379.2] Out: 420 [Urine:420]  Medications Scheduled Meds: . aspirin EC  325 mg Oral Daily  . atorvastatin  80 mg Oral q1800  . enoxaparin (LOVENOX) injection  40 mg Subcutaneous Daily  . nitroGLYCERIN  1 inch Topical Q6H  . regadenoson      . sodium chloride flush  3 mL Intravenous Q12H   Continuous Infusions: . sodium chloride 125 mL/hr at 06/01/15 0900   PRN Meds:.HYDROcodone-acetaminophen, nitroGLYCERIN  PE: General appearance: alert, cooperative and no distress Lungs: clear to auscultation bilaterally Heart: regular rate and rhythm and rate slow Extremities: No LEE Pulses: 2+ and symmetric Skin: warm and dry Neurologic: Grossly normal  Lab Results:   Recent Labs  05/31/15 2013 06/01/15 0040 06/01/15 0625  WBC 13.7* 11.5* 12.4*  HGB 14.9 13.9 13.0  HCT 44.3 41.8 38.9*  PLT 264 248 224   BMET  Recent Labs  05/31/15 2013 06/01/15 0040 06/01/15 0625  NA 138  --  137  K 4.2  --  3.6  CL 105  --  107  CO2 23  --  24  GLUCOSE 161*  --  107*  BUN 19  --  17  CREATININE 0.77 0.84 0.61  CALCIUM 9.4  --  8.6*   PT/INR  Recent Labs  06/01/15 0625  LABPROT 14.1  INR 1.07   Cholesterol  Recent Labs  06/01/15 0040  CHOL 185   Lipid Panel     Component Value Date/Time   CHOL 185 06/01/2015 0040   TRIG 57 06/01/2015 0040   HDL 32* 06/01/2015 0040   CHOLHDL 5.8 06/01/2015 0040   VLDL 11 06/01/2015 0040   LDLCALC 142* 06/01/2015 0040      Assessment/Plan  60 yo M w a h/o ankylosing spondylitis, prior tobacco abuse (quit 06/18/14), & hyperlipidemia presented this evening at 20:00 with a 1 hour episode of 10/10 chest pain that started while he was lying in bed. Cardiac cath in 2011 reported as, " no significant high-grade focal obstruction."   Principal Problem:   Chest pain with moderate risk for cardiac etiology Active Problems:   Ankylosing spondylitis (HCC)   Hyperlipidemia   Sinus bradycardia   Elevated blood pressure   RBBB    First deg AVB  Troponin negative times three.  Chest pain has decreased and as it did, became more noticeable with a deep breath.  Echo pending.  NPO.  Lexiscan cardiolyte today.  DC nitro paste.  BP controlled.  Mildly elevated WBCs?Marland Kitchen.  No fever or chills. Nothing acute on CXR    Wilburt FinlayBryan Hager PA-C 06/01/2015 8:02 AM  I have seen, examined and evaluated the patient this AM along with Mr. Leron CroakHager, [place]-C.  After reviewing all the available data and chart,  I agree with his findings, examination as well as impression recommendations.  Chest Pain with Moderate Risk for Cardiac Etiology  Patient without any real cardiac history presenting with symptoms that initially on original presentation sound  relatively concerning, however now he is having symptoms that are more consistent with potential musculoskeletal or pleuritic type discomfort.  I do agree that time with his initial presentation in his age with a smoker there is at least moderate risk for cardiac etiology and therefore a Myoview stress test is warranted. He has ruled out for MI. He is still now having some discomfort on and that would make me think this is probably more musculoskeletal at the Myoview is negatiArkansas Dept. Of Correction-Diagnostic UnitWe also been echocardiogram pending as there is some pleuritic component to it. However his EKG and other features are not consistent with pericarditis.   We will follow up the echocardiogram and Myoview and provide further  recommendations based on results.   Bryan Lemma, M.D., M.S. Interventional Cardiologist   Pager # [phone] Phone # [phone] [address] [place], [place] [address]   ADDENDUM Reviewed Myoview which suggests an anterior-anteroapical defect that looks To me on report. This is read as showing some ischemia however fixed defect with probably more consistent with prior infarct. Regardless and anterior defect in a patient with these type of presenting symptoms warrants evaluation. Unlikely to be an infarct as she he had normal anterior wall motion on the echocardiogram however.  With a potential anterior defect, I think we need to consider cardiac catheterization to exclude an LAD lesion.  We will arrange for cardiac catheterization tomorrow.   Bryan Lemma, M.D., M.S. Interventional Cardiologist   Pager # [phone] Phone # [phone] [address] [place], [place] [address]

## 2015-06-01 NOTE — Progress Notes (Signed)
  Echocardiogram 2D Echocardiogram has been performed.  Delcie RochENNINGTON, Ocean Kearley 06/01/2015, 8:47 AM

## 2015-06-01 NOTE — Progress Notes (Signed)
Myoview reviewed with Dr Zenovia JarredHarding-abnormal, pt will need cath. I discussed with the pt, he is actually having some SSCP now. The patient understands that risks included but are not limited to stroke (1 in 1000), death (1 in 1000), kidney failure [usually temporary] (1 in 500), bleeding (1 in 200), allergic reaction [possibly serious] (1 in 200).  The patient understands and agrees to proceed. He is on the schedule for Dr Tresa EndoKelly. In the meantime will try SL NTG and resume nitropaste. If he doesn't become pain free will try GI cocktail. He is not on a beta blocker secondary to bradycardia.  Corine ShelterLUKE Markisha Meding PA-C 06/01/2015 5:19 PM

## 2015-06-02 ENCOUNTER — Encounter (HOSPITAL_COMMUNITY): Payer: Self-pay | Admitting: Cardiovascular Disease

## 2015-06-02 ENCOUNTER — Encounter (HOSPITAL_COMMUNITY)
Admission: EM | Disposition: A | Discharge status: Home / Self Care | Payer: Self-pay | Source: Home / Self Care | Attending: Emergency Medicine

## 2015-06-02 DIAGNOSIS — I4729 Other ventricular tachycardia: Secondary | ICD-10-CM

## 2015-06-02 DIAGNOSIS — R9439 Abnormal result of other cardiovascular function study: Secondary | ICD-10-CM | POA: Insufficient documentation

## 2015-06-02 DIAGNOSIS — R079 Chest pain, unspecified: Secondary | ICD-10-CM | POA: Insufficient documentation

## 2015-06-02 DIAGNOSIS — I472 Ventricular tachycardia: Secondary | ICD-10-CM

## 2015-06-02 HISTORY — PX: CARDIAC CATHETERIZATION: SHX172

## 2015-06-02 LAB — BASIC METABOLIC PANEL
Anion gap: 5 (ref 5–15)
BUN: 8 mg/dL (ref 6–20)
CO2: 25 mmol/L (ref 22–32)
Calcium: 8.4 mg/dL — ABNORMAL LOW (ref 8.9–10.3)
Chloride: 109 mmol/L (ref 101–111)
Creatinine, Ser: 0.64 mg/dL (ref 0.61–1.24)
GFR calc Af Amer: >60 mL/min (ref >60)
GFR calc non Af Amer: >60 mL/min (ref >60)
Glucose, Bld: 117 mg/dL — ABNORMAL HIGH (ref 65–99)
Potassium: 3.6 mmol/L (ref 3.5–5.1)
Sodium: 139 mmol/L (ref 135–145)

## 2015-06-02 LAB — PROTIME-INR
INR: 1.08 (ref 0.00–1.49)
Prothrombin Time: 14.2 seconds (ref 11.6–15.2)

## 2015-06-02 LAB — HEMOGLOBIN A1C
HEMOGLOBIN A1C: 5.8 % — AB (ref 4.8–5.6)
Mean Plasma Glucose: 120 mg/dL

## 2015-06-02 SURGERY — LEFT HEART CATH AND CORONARY ANGIOGRAPHY

## 2015-06-02 MED ORDER — ONDANSETRON HCL 4 MG/2ML IJ SOLN: 4.0000 mg | Freq: Four times a day (QID) | INTRAMUSCULAR | Status: DC | PRN

## 2015-06-02 MED ORDER — SODIUM CHLORIDE 0.9 % WEIGHT BASED INFUSION
1.0000 mL/kg/h | INTRAVENOUS | Status: DC
  Administered 2015-06-02: 1 mL/kg/h via INTRAVENOUS

## 2015-06-02 MED ORDER — MIDAZOLAM HCL 2 MG/2ML IJ SOLN
INTRAMUSCULAR | Status: AC
Start: 2015-06-02 — End: 2015-06-02
  Filled 2015-06-02: qty 2

## 2015-06-02 MED ORDER — SODIUM CHLORIDE 0.9 % IV SOLN: 250.0000 mL | INTRAVENOUS | Status: DC | PRN

## 2015-06-02 MED ORDER — HEPARIN SODIUM (PORCINE) 1000 UNIT/ML IJ SOLN
INTRAMUSCULAR | Status: AC
  Filled 2015-06-02: qty 1

## 2015-06-02 MED ORDER — ASPIRIN EC 81 MG PO TBEC: 81.0000 mg | DELAYED_RELEASE_TABLET | Freq: Every day | ORAL | Status: DC

## 2015-06-02 MED ORDER — HEPARIN (PORCINE) IN NACL 2-0.9 UNIT/ML-% IJ SOLN
INTRAMUSCULAR | Status: AC
  Filled 2015-06-02: qty 1500

## 2015-06-02 MED ORDER — LIDOCAINE HCL (PF) 1 % IJ SOLN
INTRAMUSCULAR | Status: AC
  Filled 2015-06-02: qty 30

## 2015-06-02 MED ORDER — IOPAMIDOL (ISOVUE-370) INJECTION 76%
INTRAVENOUS | Status: DC | PRN
Start: 2015-06-02 — End: 2015-06-02
  Administered 2015-06-02: 70 mL via INTRA_ARTERIAL

## 2015-06-02 MED ORDER — SODIUM CHLORIDE 0.9% FLUSH: 3.0000 mL | INTRAVENOUS | Status: DC | PRN

## 2015-06-02 MED ORDER — LIDOCAINE HCL (PF) 1 % IJ SOLN
INTRAMUSCULAR | Status: DC | PRN
Start: 2015-06-02 — End: 2015-06-02
  Administered 2015-06-02: 15 mL via INTRA_ARTERIAL

## 2015-06-02 MED ORDER — SODIUM CHLORIDE 0.9% FLUSH
3.0000 mL | Freq: Two times a day (BID) | INTRAVENOUS | Status: DC
  Administered 2015-06-02: 3 mL via INTRAVENOUS

## 2015-06-02 MED ORDER — ATORVASTATIN CALCIUM 40 MG PO TABS
40.0000 mg | ORAL_TABLET | Freq: Every day | ORAL | Status: DC
Start: 2015-06-02 — End: 2017-05-10

## 2015-06-02 MED ORDER — HEPARIN SODIUM (PORCINE) 1000 UNIT/ML IJ SOLN
INTRAMUSCULAR | Status: DC | PRN
  Administered 2015-06-02: 5000 [IU] via INTRAVENOUS

## 2015-06-02 MED ORDER — HEPARIN (PORCINE) IN NACL 2-0.9 UNIT/ML-% IJ SOLN
INTRAMUSCULAR | Status: DC | PRN
  Administered 2015-06-02: 1500 mL

## 2015-06-02 MED ORDER — ACETAMINOPHEN 325 MG PO TABS: 650.0000 mg | ORAL_TABLET | ORAL | Status: DC | PRN

## 2015-06-02 MED ORDER — FENTANYL CITRATE (PF) 100 MCG/2ML IJ SOLN
INTRAMUSCULAR | Status: AC
  Filled 2015-06-02: qty 2

## 2015-06-02 MED ORDER — SODIUM CHLORIDE 0.9 % WEIGHT BASED INFUSION: 3.0000 mL/kg/h | INTRAVENOUS | Status: AC

## 2015-06-02 MED ORDER — VERAPAMIL HCL 2.5 MG/ML IV SOLN
INTRAVENOUS | Status: AC
  Filled 2015-06-02: qty 2

## 2015-06-02 MED ORDER — DIAZEPAM 5 MG PO TABS: 5.0000 mg | ORAL_TABLET | Freq: Four times a day (QID) | ORAL | Status: DC | PRN

## 2015-06-02 MED ORDER — MIDAZOLAM HCL 2 MG/2ML IJ SOLN
INTRAMUSCULAR | Status: DC | PRN
Start: 2015-06-02 — End: 2015-06-02
  Administered 2015-06-02: 2 mg via INTRAVENOUS

## 2015-06-02 MED ORDER — IOPAMIDOL (ISOVUE-370) INJECTION 76%
INTRAVENOUS | Status: AC
  Filled 2015-06-02: qty 100

## 2015-06-02 MED ORDER — NITROGLYCERIN 1 MG/10 ML FOR IR/CATH LAB
INTRA_ARTERIAL | Status: AC
  Filled 2015-06-02: qty 10

## 2015-06-02 MED ORDER — FENTANYL CITRATE (PF) 100 MCG/2ML IJ SOLN
INTRAMUSCULAR | Status: DC | PRN
  Administered 2015-06-02: 25 ug via INTRAVENOUS

## 2015-06-02 SURGICAL SUPPLY — 10 items
CATH INFINITI 5FR ANG PIGTAIL (CATHETERS) ×3 IMPLANT
CATH OPTITORQUE TIG 4.0 5F (CATHETERS) ×3 IMPLANT
DEVICE RAD COMP TR BAND LRG (VASCULAR PRODUCTS) ×3 IMPLANT
GLIDESHEATH SLEND SS 6F .021 (SHEATH) ×3 IMPLANT
KIT HEART LEFT (KITS) ×3 IMPLANT
PACK CARDIAC CATHETERIZATION (CUSTOM PROCEDURE TRAY) ×3 IMPLANT
SYR MEDRAD MARK V 150ML (SYRINGE) ×3 IMPLANT
TRANSDUCER W/STOPCOCK (MISCELLANEOUS) ×3 IMPLANT
TUBING CIL FLEX 10 FLL-RA (TUBING) ×3 IMPLANT
WIRE SAFE-T 1.5MM-J .035X260CM (WIRE) ×3 IMPLANT

## 2015-06-02 NOTE — H&P (View-Only) (Signed)
Subjective: Mild CP continues.  Worse with a deep breath.   Objective: Vital signs in last 24 hours: Temp:  [97.6 F (36.4 C)-98.2 F (36.8 C)] 98.2 F (36.8 C) (05/15 1447) Pulse Rate:  [41-66] 60 (05/15 1447) Resp:  [11-20] 18 (05/15 1308) BP: (122-165)/(57-93) 142/63 mmHg (05/15 1447) SpO2:  [97 %-100 %] 99 % (05/15 1447) Weight:  [230 lb (104.327 kg)] 230 lb (104.327 kg) (05/14 2006) Last BM Date: 05/31/15  Intake/Output from previous day:   Intake/Output this shift: Total I/O In: 1379.2 [I.V.:1379.2] Out: 420 [Urine:420]  Medications Scheduled Meds: . aspirin EC  325 mg Oral Daily  . atorvastatin  80 mg Oral q1800  . enoxaparin (LOVENOX) injection  40 mg Subcutaneous Daily  . nitroGLYCERIN  1 inch Topical Q6H  . regadenoson      . sodium chloride flush  3 mL Intravenous Q12H   Continuous Infusions: . sodium chloride 125 mL/hr at 06/01/15 0900   PRN Meds:.HYDROcodone-acetaminophen, nitroGLYCERIN  PE: General appearance: alert, cooperative and no distress Lungs: clear to auscultation bilaterally Heart: regular rate and rhythm and rate slow Extremities: No LEE Pulses: 2+ and symmetric Skin: warm and dry Neurologic: Grossly normal  Lab Results:   Recent Labs  05/31/15 2013 06/01/15 0040 06/01/15 0625  WBC 13.7* 11.5* 12.4*  HGB 14.9 13.9 13.0  HCT 44.3 41.8 38.9*  PLT 264 248 224   BMET  Recent Labs  05/31/15 2013 06/01/15 0040 06/01/15 0625  NA 138  --  137  K 4.2  --  3.6  CL 105  --  107  CO2 23  --  24  GLUCOSE 161*  --  107*  BUN 19  --  17  CREATININE 0.77 0.84 0.61  CALCIUM 9.4  --  8.6*   PT/INR  Recent Labs  06/01/15 0625  LABPROT 14.1  INR 1.07   Cholesterol  Recent Labs  06/01/15 0040  CHOL 185   Lipid Panel     Component Value Date/Time   CHOL 185 06/01/2015 0040   TRIG 57 06/01/2015 0040   HDL 32* 06/01/2015 0040   CHOLHDL 5.8 06/01/2015 0040   VLDL 11 06/01/2015 0040   LDLCALC 142* 06/01/2015 0040      Assessment/Plan  60 yo M w a h/o ankylosing spondylitis, prior tobacco abuse (quit 06/18/14), & hyperlipidemia presented this evening at 20:00 with a 1 hour episode of 10/10 chest pain that started while he was lying in bed. Cardiac cath in 2011 reported as, " no significant high-grade focal obstruction."   Principal Problem:   Chest pain with moderate risk for cardiac etiology Active Problems:   Ankylosing spondylitis (HCC)   Hyperlipidemia   Sinus bradycardia   Elevated blood pressure   RBBB    First deg AVB  Troponin negative times three.  Chest pain has decreased and as it did, became more noticeable with a deep breath.  Echo pending.  NPO.  Lexiscan cardiolyte today.  DC nitro paste.  BP controlled.  Mildly elevated WBCs?Marland Kitchen.  No fever or chills. Nothing acute on CXR    Wilburt FinlayBryan Hager PA-C 06/01/2015 8:02 AM  I have seen, examined and evaluated the patient this AM along with Mr. Leron CroakHager, [place]PA-C.  After reviewing all the available data and chart,  I agree with his findings, examination as well as impression recommendations.  Chest Pain with Moderate Risk for Cardiac Etiology  Patient without any real cardiac history presenting with symptoms that initially on original presentation sound  relatively concerning, however now he is having symptoms that are more consistent with potential musculoskeletal or pleuritic type discomfort.  I do agree that time with his initial presentation in his age with a smoker there is at least moderate risk for cardiac etiology and therefore a Myoview stress test is warranted. He has ruled out for MI. He is still now having some discomfort on and that would make me think this is probably more musculoskeletal at the Myoview is negative. We also been echocardiogram pending as there is some pleuritic component to it. However his EKG and other features are not consistent with pericarditis.   We will follow up the echocardiogram and Myoview and provide further  recommendations based on results.   David Harding, M.D., M.S. Interventional Cardiologist   Pager # [phone] Phone # [phone] [address] [place], [place] [address]   ADDENDUM Reviewed Myoview which suggests an anterior-anteroapical defect that looks To me on report. This is read as showing some ischemia however fixed defect with probably more consistent with prior infarct. Regardless and anterior defect in a patient with these type of presenting symptoms warrants evaluation. Unlikely to be an infarct as she he had normal anterior wall motion on the echocardiogram however.  With a potential anterior defect, I think we need to consider cardiac catheterization to exclude an LAD lesion.  We will arrange for cardiac catheterization tomorrow.   David Harding, M.D., M.S. Interventional Cardiologist   Pager # [phone] Phone # [phone] [address] [place], [place] [address]  

## 2015-06-02 NOTE — Interval H&P Note (Signed)
Cath Lab Visit (complete for each Cath Lab visit)  Clinical Evaluation Leading to the Procedure:   ACS: No.  Non-ACS:    Anginal Classification: CCS IV  Anti-ischemic medical therapy: Minimal Therapy (1 class of medications)  Non-Invasive Test Results: Intermediate-risk stress test findings: cardiac mortality 1-3%/year  Prior CABG: No previous CABG      History and Physical Interval Note:  06/02/2015 1:47 PM  Joshua Kramer  has presented today for surgery, with the diagnosis of cp  The various methods of treatment have been discussed with the patient and family. After consideration of risks, benefits and other options for treatment, the patient has consented to  Procedure(s): Left Heart Cath and Coronary Angiography (N/A) as a surgical intervention .  The patient's history has been reviewed, patient examined, no change in status, stable for surgery.  I have reviewed the patient's chart and labs.  Questions were answered to the patient's satisfaction.     Nicki Guadalajarahomas Afnan Cadiente

## 2015-06-02 NOTE — Progress Notes (Signed)
Subjective: No complaints  Objective: Vital signs in last 24 hours: Temp:  [98.1 F (36.7 C)-98.2 F (36.8 C)] 98.1 F (36.7 C) (05/16 0400) Pulse Rate:  [56-60] 57 (05/16 0400) Resp:  [16-20] 20 (05/16 0400) BP: (109-160)/(60-77) 109/60 mmHg (05/16 0400) SpO2:  [98 %-99 %] 98 % (05/16 0400) Weight:  [230 lb 9.6 oz (104.599 kg)] 230 lb 9.6 oz (104.599 kg) (05/16 0400) Last BM Date: 06/01/15  Intake/Output from previous day: 05/15 0701 - 05/16 0700 In: 3890.7 [P.O.:240; I.V.:3650.7] Out: 2370 [Urine:2370] Intake/Output this shift:    Medications Scheduled Meds: . aspirin EC  325 mg Oral Daily  . atorvastatin  80 mg Oral q1800  . enoxaparin (LOVENOX) injection  50 mg Subcutaneous Q24H  . nitroGLYCERIN  0.5 inch Topical Q6H  . nitroGLYCERIN  1 inch Topical Q6H  . sodium chloride flush  3 mL Intravenous Q12H  . sodium chloride flush  3 mL Intravenous Q12H   Continuous Infusions: . sodium chloride 125 mL/hr at 06/01/15 2110  . sodium chloride 1 mL/kg/hr (06/02/15 0551)   PRN Meds:.sodium chloride, acetaminophen, ALPRAZolam, gi cocktail, HYDROcodone-acetaminophen, nitroGLYCERIN, ondansetron (ZOFRAN) IV, sodium chloride flush, zolpidem  PE: General appearance: alert, cooperative, no distress and Patient was resting flat in the bed when I saw him.   Lungs: clear to auscultation bilaterally Heart: regular rate and rhythm and +S3 Extremities: No LEE Pulses: 2+ and symmetric Skin: warm and dry Neurologic: Grossly normal  Lab Results:   Recent Labs  05/31/15 2013 06/01/15 0040 06/01/15 0625  WBC 13.7* 11.5* 12.4*  HGB 14.9 13.9 13.0  HCT 44.3 41.8 38.9*  PLT 264 248 224   BMET  Recent Labs  05/31/15 2013 06/01/15 0040 06/01/15 0625 06/02/15 0127  NA 138  --  137 139  K 4.2  --  3.6 3.6  CL 105  --  107 109  CO2 23  --  24 25  GLUCOSE 161*  --  107* 117*  BUN 19  --  17 8  CREATININE 0.77 0.84 0.61 0.64  CALCIUM 9.4  --  8.6* 8.4*    PT/INR  Recent Labs  06/01/15 0625 06/02/15 0127  LABPROT 14.1 14.2  INR 1.07 1.08   Cholesterol  Recent Labs  06/01/15 0040  CHOL 185   Lipid Panel     Component Value Date/Time   CHOL 185 06/01/2015 0040   TRIG 57 06/01/2015 0040   HDL 32* 06/01/2015 0040   CHOLHDL 5.8 06/01/2015 0040   VLDL 11 06/01/2015 0040   LDLCALC 142* 06/01/2015 0040    Assessment/Plan   Principal Problem:   Chest pain with moderate risk for cardiac etiology Active Problems:   Ankylosing spondylitis (HCC)   Hyperlipidemia   Sinus bradycardia   Elevated blood pressure   NSVT (nonsustained ventricular tachycardia) (HCC)  Ruled out for MI.  He underwent a lexiscan cardiolyteyesterday which was abnormal with possible anterior defect.  Left heart cath today at 1030hrs with Dr. Tresa EndoKelly.  One 4 beat run of NSVT on tele.  No BB due to bradycardia.  Continue to monitor.   BP controlled.   LDL 142.  Add statin with positive nuc.    Wilburt FinlayBryan Hager PA-C 06/02/2015 8:27 AM    Agree with assessment and plan. I suspect that the nuclear test is artifactual.  The patient underwent cardiac catheterization which revealed normal coronary arteries and normal LV systolic function.  There is a suggestion of mild angiographic mitral valve prolapse.  I suspect  that the patient's chest pain is noncardiac.  Other etiologies can include potential esophageal spasm, GI etiology or potential musculoskeletal.  With his LDL elevated, would aggressively treat with statin therapy.  The catheterization was done from the right radial approach.  The patient is stable from a cardiac standpoint.  if plans are for discharge later today.   Lennette Bihari, MD, [facility] 06/02/2015 3:09 PM

## 2015-06-02 NOTE — Discharge Summary (Signed)
Discharge Summary    Patient ID: Joshua Kramer,  MRN: [id number], DOB/AGE: 04-06-1955 60 y.o.  Admit date: 05/31/2015 Discharge date: 06/02/2015  Primary Care Provider: [facility]OM Primary Cardiologist: New- Dr. Herbie Baltimore   Discharge Diagnoses    Principal Problem:   Chest pain with moderate risk for cardiac etiology Active Problems:   Ankylosing spondylitis (HCC)   Hyperlipidemia   Sinus bradycardia   Elevated blood pressure   NSVT (nonsustained ventricular tachycardia) (HCC)   Allergies No Known Allergies   History of Present Illness      60 yo M w a h/o ankylosing spondylitis, prior tobacco abuse (quit 06/18/14), & hyperlipidemia who presented to [facility] ED on 05/31/15 with chest pain.   He had associated diaphoresis, dyspnea, & lightheadedness. He denied association of the pain with activity, oral intake, deep breaths, or position. His ECG revealed a RBBB with sinus bradycardia & first degree AVB (progressed from his nonspecific intraventricular conduction block back in 2011). With Morphine 4 mg given in the ED, his pain improved to a 8/10. With NTG x 1, improved to a 5/10. With a second NTG, improved to a 1/10. He was also given ASA 324.   He had similar but mild pain back in 2011 when he underwent a cardiac catheterization that was unremarkable. A TTE from that time revealed an EF of 55-60% with grade I diastolic dysfunction. Since that time, he had not had further chest pain. At baseline, his physical activity is somewhat limited by his ankylosing spondylitis as his neck is "90% fused." He was admitted for further work up.   Hospital Course     Consultants: none  He ruled out for MI. He has 4 beats of NSVT. 2D ECHO showed normal LV function and G1DD. He underwent nuclear stress test on 06/01/15 which which was abnormal with possible anterior defect. He was then referred to coronary angiography on 06/02/15 which revealed normal coronary arteries and normal LV  systolic function. There was a suggestion of mild angiographic mitral valve prolapse. Dr. Tresa Endo suspected that the patient's chest pain was noncardiac. Other etiologies can include potential esophageal spasm, GI etiology or potential musculoskeletal. With his LDL elevated, would aggressively treat with statin therapy. He was discharged on mod intensity statin therapy.    The patient has had an uncomplicated hospital course and is recovering well. The radial catheter site is stable He has been seen by Dr. Tresa Endo today and deemed ready for discharge home. Discharge medications are listed below.  _____________  Discharge Vitals Blood pressure 121/74, pulse 57, temperature 98.1 F (36.7 C), temperature source Oral, resp. rate 27, height 6' (1.829 m), weight 230 lb 9.6 oz (104.599 kg), SpO2 99 %.  Filed Weights   05/31/15 2006 06/02/15 0400  Weight: 230 lb (104.327 kg) 230 lb 9.6 oz (104.599 kg)    Labs & Radiologic Studies     CBC  Recent Labs  06/01/15 0040 06/01/15 0625  WBC 11.5* 12.4*  HGB 13.9 13.0  HCT 41.8 38.9*  MCV 87.4 87.0  PLT 248 224   Basic Metabolic Panel  Recent Labs  06/01/15 0625 06/02/15 0127  NA 137 139  K 3.6 3.6  CL 107 109  CO2 24 25  GLUCOSE 107* 117*  BUN 17 8  CREATININE 0.61 0.64  CALCIUM 8.6* 8.4*  MG 2.0  --    Liver Function Tests  Recent Labs  05/31/15 2013 06/01/15 0625  AST 15 12*  ALT 17 14*  ALKPHOS  73 61  BILITOT 1.1 0.7  PROT 7.8 6.5  ALBUMIN 4.0 3.3*    Recent Labs  05/31/15 2013  LIPASE 23   Cardiac Enzymes  Recent Labs  06/01/15 0040 06/01/15 0625 06/01/15 1453  TROPONINI <0.03 <0.03 <0.03   BNP Invalid input(s): POCBNP D-Dimer  Recent Labs  06/01/15 1453  DDIMER 0.37   Hemoglobin A1C  Recent Labs  06/01/15 0040  HGBA1C 5.8*   Fasting Lipid Panel  Recent Labs  06/01/15 0040  CHOL 185  HDL 32*  LDLCALC 142*  TRIG 57  CHOLHDL 5.8   Thyroid Function Tests  Recent Labs   06/01/15 0040  TSH 1.588    Dg Chest 2 View  05/31/2015  CLINICAL DATA:  Initial valuation for acute central chest pain. EXAM: CHEST  2 VIEW COMPARISON:  Prior study from 06/11/2010 FINDINGS: The cardiac and mediastinal silhouettes are stable in size and contour, and remain within normal limits. The lungs are normally inflated. Mild diffuse bronchitic changes, similar to prior. No airspace consolidation, pleural effusion, or pulmonary edema is identified. There is no pneumothorax. No acute osseous abnormality identified. IMPRESSION: 1. No active cardiopulmonary disease. 2. Mild diffuse bronchitic changes, similar to prior. Electronically Signed   By: Rise MuBenjamin  McClintock M.D.   On: 05/31/2015 22:05   Nm Myocar Multi W/spect W/wall Motion / Ef  06/01/2015   Defect 1: There is a medium defect of mild severity present in the mid inferior and apical inferior location.  Findings consistent with ischemia.  This is an intermediate risk study.  The left ventricular ejection fraction is mildly decreased (45-54%).  Nuclear stress EF: 54%.      Diagnostic Studies/Procedures  06/02/15 Procedures    Left Heart Cath and Coronary Angiography    Conclusion     The left ventricular systolic function is normal.  Normal LV function with angiographic evidence for mild mitral valve prolapse.  Normal coronary arteries without obstructive disease.  RECOMMENDATION: Medical therapy. I suspect the abnormality seen on the nuclear stress test is artifactual. Consider noncardiac etiologies for his rest chest tightness.     _____________   2D ECHO: 06/01/2015 LV EF: 55% - 60% Study Conclusions - Left ventricle: The cavity size was normal. Systolic function was  normal. The estimated ejection fraction was in the range of 55%  to 60%. Wall motion was normal; there were no regional wall  motion abnormalities. Doppler parameters are consistent with  abnormal left ventricular relaxation  (grade 1 diastolic  dysfunction). Doppler parameters are consistent with mildly  elevated ventricular end-diastolic filling pressure. - Aortic valve: Trileaflet; normal thickness leaflets. There was no  regurgitation. - Left atrium: The atrium was normal in size. - Right ventricle: The cavity size was mildly dilated. Wall  thickness was normal. Systolic function was normal. - Tricuspid valve: There was trivial regurgitation. - Pulmonary arteries: Systolic pressure was within the normal  range. - Inferior vena cava: The vessel was normal in size. - Pericardium, extracardiac: There was no pericardial effusion.   Disposition   Pt is being discharged home today in good condition.  Follow-up Plans & Appointments    Follow-up Information    [facility].   Specialty:  Family Medicine   Why:  Please follow up with your PCP. you do not require cardiology follow up   Contact information:   [address] [place] [id number] [phone]        Discharge Medications   Current  Discharge Medication List    START taking these medications   Details  atorvastatin (LIPITOR) 40 MG tablet Take 1 tablet (40 mg total) by mouth daily at 6 PM. Qty: 90 tablet, Refills: 3      CONTINUE these medications which have NOT CHANGED   Details  HYDROcodone-acetaminophen (NORCO) 7.5-325 MG tablet Take 1 tablet by mouth every 6 (six) hours as needed. July 2017 RX Qty: 120 tablet, Refills: 0    Thiamine HCl (VITAMIN B-1) 250 MG tablet Take 250 mg by mouth daily.    vitamin E (VITAMIN E) 400 UNIT capsule Take 400 Units by mouth daily.         Outstanding Labs/Studies   none  Duration of Discharge Encounter   Greater than 30 minutes including physician time.  Signed, Cline Crock PA-C 06/02/2015, 5:51 PM

## 2015-06-02 NOTE — Plan of Care (Signed)
Problem: Education: Goal: Knowledge of [facility] Education information/materials will improve Outcome: Progressing Reviewed unit procedures and policies with his admission assessment and will answered his questions, addressed the need to call for assistance and showed him how to call for his RN/NT and he was able to return demonstrate how to call for assistance, explained about the bed alarm system for ALL patients after 11pm and he was okay with that, will continue to monitor.

## 2015-06-02 NOTE — Plan of Care (Signed)
Problem: Pain Managment: Goal: General experience of comfort will improve Outcome: Progressing Addressed pain location, quality, radiation, duration, rating and how to relieve it, practiced deep breathing and relaxation techniques with patient and made sure he knew how to call me if he needed something for pain, will continue to monitor.

## 2015-06-02 NOTE — Progress Notes (Signed)
Updated report received via Dawn/Shanell RN using SBAR format, reviewed new orders, VS and events of the day, will assume care of patient.

## 2015-06-02 NOTE — Plan of Care (Signed)
Problem: Safety: Goal: Ability to remain free from injury will improve Outcome: Progressing Reviewed importance of calling for assistance again because patient was OOB on his own, will place bed alarm on at this time and continue to monitor.

## 2015-06-02 NOTE — Plan of Care (Signed)
Problem: Consults Goal: Chest Pain Patient Education (See Patient Education module for education specifics.)  Outcome: Progressing Reviewed and answered all questions regarding heart attack, stress test and cardiac catheterization and had patient reiterate what he learned and he verbalized understanding, will make him NPO for stress test 5/15 am and also 5/16 am for a heart cath., reviewed that teaching with him and also his wife who was in the room, they both verbalized understanding of material covered.

## 2015-06-02 NOTE — Progress Notes (Signed)
Discharge teaching and instructions reviewed, radial site instructions given. VSS. Pt has no further questions at this time. Awaiting wife for pickup

## 2015-06-14 ENCOUNTER — Encounter: Payer: Self-pay | Admitting: Family Medicine

## 2015-07-28 ENCOUNTER — Encounter: Payer: Self-pay | Admitting: Family Medicine

## 2015-09-22 ENCOUNTER — Telehealth: Payer: Self-pay | Admitting: Family Medicine

## 2015-09-22 NOTE — Telephone Encounter (Signed)
Asking for rx for his hydrocodone  [phone]

## 2015-09-22 NOTE — Telephone Encounter (Signed)
Ok torefill  X 3 months  

## 2015-09-22 NOTE — Telephone Encounter (Signed)
ok 

## 2015-09-23 MED ORDER — HYDROCODONE-ACETAMINOPHEN 7.5-325 MG PO TABS: 1.0000 | ORAL_TABLET | Freq: Four times a day (QID) | ORAL | 0 refills | Status: DC | PRN

## 2015-09-23 NOTE — Telephone Encounter (Signed)
RX printed, left up front and patient aware to pick up tomorrow viavm 

## 2016-01-04 ENCOUNTER — Telehealth: Payer: Self-pay | Admitting: Family Medicine

## 2016-01-04 MED ORDER — HYDROCODONE-ACETAMINOPHEN 7.5-325 MG PO TABS: 1.0000 | ORAL_TABLET | Freq: Four times a day (QID) | ORAL | 0 refills | Status: DC | PRN

## 2016-01-04 NOTE — Telephone Encounter (Signed)
ok 

## 2016-01-04 NOTE — Telephone Encounter (Signed)
RX printed x 3 months, left up front and patient aware to pick up  

## 2016-01-04 NOTE — Telephone Encounter (Signed)
Ok to refill 

## 2016-01-04 NOTE — Telephone Encounter (Signed)
Patient requesting a refill on her HYDROcodone-acetaminophen (NORCO) 7.5-325 MG tablet    CB# [phone]

## 2016-02-12 ENCOUNTER — Telehealth: Payer: Self-pay | Admitting: Family Medicine

## 2016-02-12 MED ORDER — HYDROCODONE-ACETAMINOPHEN 7.5-325 MG PO TABS: 1.0000 | ORAL_TABLET | Freq: Four times a day (QID) | ORAL | 0 refills | Status: DC | PRN

## 2016-02-12 NOTE — Telephone Encounter (Signed)
Pt just picked up Hydrocodone #120.  Wanted to split in half for financial reasons.  Can he exchange his #120 Rx for tow #60 scripts

## 2016-02-12 NOTE — Telephone Encounter (Signed)
Provider OK with split prescriptions.  2 - 15 day prescriptions printed, one with DNF until 02/27/16.  Pt told must return prescription given for #120 tabs earlier.  He said he will and Thank you.

## 2016-02-12 NOTE — Telephone Encounter (Signed)
OK to do-

## 2016-02-12 NOTE — Telephone Encounter (Signed)
ok 

## 2016-02-15 ENCOUNTER — Telehealth: Payer: Self-pay | Admitting: Family Medicine

## 2016-02-15 MED ORDER — HYDROCODONE-ACETAMINOPHEN 7.5-325 MG PO TABS: 1.0000 | ORAL_TABLET | Freq: Four times a day (QID) | ORAL | 0 refills | Status: DC | PRN

## 2016-02-15 NOTE — Telephone Encounter (Signed)
Pt needs a refill on the hydrocodone. Please call back at [phone]

## 2016-02-15 NOTE — Telephone Encounter (Signed)
Pt did return on Friday and returned original prescription.  New Rx's given

## 2016-02-15 NOTE — Telephone Encounter (Signed)
Pt called and stated that he had a problem with the rx because it says do not fill until 02/18/16. He has not filled an rx in January d/t financial reasons. He did bring back the January rx and was given the 2 for #60 but it was put on there do not fill until 02/27/16 - called pharmacy and give verbal over ride to fill one of those rx's early. Pt is aware and if any further problems will call back.

## 2016-02-15 NOTE — Addendum Note (Signed)
Addended by: Legrand RamsWILLIS, SANDY B on: 02/15/2016 03:22 PM   Modules accepted: Orders

## 2016-05-09 ENCOUNTER — Telehealth: Payer: Self-pay | Admitting: Family Medicine

## 2016-05-09 NOTE — Telephone Encounter (Signed)
Last ov 5/17.  Ok with temp refill but ntbs

## 2016-05-09 NOTE — Telephone Encounter (Signed)
Patient is requesting a refill on Hydrocodone - Ok to refill??        

## 2016-05-10 MED ORDER — HYDROCODONE-ACETAMINOPHEN 7.5-325 MG PO TABS: 1.0000 | ORAL_TABLET | Freq: Four times a day (QID) | ORAL | 0 refills | Status: DC | PRN

## 2016-05-10 NOTE — Telephone Encounter (Signed)
RX printed, left up front and patient aware to pick up and will make appt when he picks up his rx

## 2016-06-08 ENCOUNTER — Encounter: Payer: Self-pay | Admitting: Family Medicine

## 2016-06-08 ENCOUNTER — Ambulatory Visit (INDEPENDENT_AMBULATORY_CARE_PROVIDER_SITE_OTHER): Payer: Self-pay | Admitting: Family Medicine

## 2016-06-08 ENCOUNTER — Other Ambulatory Visit: Payer: Self-pay | Admitting: Family Medicine

## 2016-06-08 VITALS — BP 120/70 | HR 78 | Temp 98.1°F | Resp 20 | Ht 73.0 in | Wt 247.0 lb

## 2016-06-08 DIAGNOSIS — Z79891 Long term (current) use of opiate analgesic: Secondary | ICD-10-CM

## 2016-06-08 DIAGNOSIS — M450 Ankylosing spondylitis of multiple sites in spine: Secondary | ICD-10-CM

## 2016-06-08 MED ORDER — HYDROCODONE-ACETAMINOPHEN 7.5-325 MG PO TABS: 1.0000 | ORAL_TABLET | Freq: Four times a day (QID) | ORAL | 0 refills | Status: DC | PRN

## 2016-06-08 NOTE — Progress Notes (Signed)
Subjective:    Patient ID: Joshua Kramer, male    DOB: 04/20/1955, 61 y.o.   MRN: [id number]  HPI10/13/16 Patient has a history of ankylosing spondylitis. He is tried numerous biologic agents to prevent exacerbations of this under the care of a rheumatologist. He failed many of them either due to cost or secondary infections. He was unable to tolerate enbrel or humira.  He tried sulfasalazine in the past without any benefit. He is currently using Norco 7.5/325 one 4 times a day. He states that his pain is getting worse. He continues to have severe pain in his lower back, in his hands, and in his knees. He has very little range of motion in his neck. His neck is essentially fused. He must turn his entire torso to look laterally. He has inflammatory swelling in the DIP joint on his right fourth digit.  He is interested in increasing the frequency of his pain medication.  AT that time, my plan was: I am fine refilling the patient's Norco. He can continue to take this 4 times a day. I will give him 120 tablets with 2 refills. Patient has no insurance and therefore he cannot afford the biologic agents. He cannot afford lab work. However I insisted that we get a CMP to monitor his kidney/liver function and blood sugar. I will also start the patient on prednisone 10 mg a day empirically for one month to see if his quality of life improves. If it does, we may want to consider continuing this long-term and then have a discussion regarding the side effects that prolonged glucocorticoid use can cause.  05/29/15 Patient is here today requesting a refill on his pain medication. There is no evidence of abuse or diversion. Unfortunately the patient still does not have any health insurance. Therefore he still has not had a colonoscopy. He is overdue for a digital rectal exam and PSA. He is also overdue for fasting lipid panel. Based on his age, he would also qualify for hepatitis C screening. Based on his age he will  be due for the shingles vaccine. I have recommended all these things to the patient today. Obviously cost is a concern. He is interested in applying for disability and possibly Medicare/Medicaid. We had a discussion today about the process for doing this.  At that time, my plan was: I refilled the patient's pain medication for a total of 3 months. There is no evidence of abuse or diversion. I recommended a colonoscopy, I recommended a PSA. I recommended a fasting lipid panel. I recommended a shingles vaccine. I recommended hepatitis C screening. The patient believes he will come back to have his cholesterol checked. I am fine with that. He would like to do this one of the time to make it more affordable which I certainly understand. He is also going to inquire about disability with social services  06/08/16 The patient has recently applied for disability. He anticipates hearing a result by August.  He is unable to work and is in constant pain. He has significant pain primarily in his neck lower back knees and feet. He has to take hydrocodone every 6 hours. He declines a CMP today to monitor his liver function tests because he cannot afford it. He begrudgingly consents to urine drug screen because I said that this was mandatory. He continues to not have health insurance. Therefore he continues to refuse a colonoscopy, a prostate exam, or hepatitis C screening. He also declines a  screening cholesterol level. Past Medical History:  Diagnosis Date  . Ankylosing spondylitis of multiple sites in spine (HCC)   . Chest pain 05/2015   a. abnormal nuc stress test with normal cors and LV function on subsequent cath  . History of tobacco abuse    Past Surgical History:  Procedure Laterality Date  . CARDIAC CATHETERIZATION  2011   nl cors, nl EF  . CARDIAC CATHETERIZATION N/A 06/02/2015   Procedure: Left Heart Cath and Coronary Angiography;  Surgeon: Lennette Bihari, MD;  [facility] INV[facility]IVE CV LAB;  Service:  Cardiovascular;  Laterality: N/A;   Current Outpatient Prescriptions on File Prior to Visit  Medication Sig Dispense Refill  . HYDROcodone-acetaminophen (NORCO) 7.5-325 MG tablet Take 1 tablet by mouth every 6 (six) hours as needed. 60 tablet 0  . Thiamine HCl (VITAMIN B-1) 250 MG tablet Take 250 mg by mouth daily.    . vitamin E (VITAMIN E) 400 UNIT capsule Take 400 Units by mouth daily.    Marland Kitchen atorvastatin (LIPITOR) 40 MG tablet Take 1 tablet (40 mg total) by mouth daily at 6 PM. (Patient not taking: Reported on 06/08/2016) 90 tablet 3   No current facility-administered medications on file prior to visit.    No Known Allergies Social History   Social History  . Marital status: Married    Spouse name: N/A  . Number of children: 1  . Years of education: N/A   Occupational History  . Sales    Social History Main Topics  . Smoking status: Former Smoker    Quit date: 06/18/2014  . Smokeless tobacco: Never Used  . Alcohol use No  . Drug use: No  . Sexual activity: Yes   Other Topics Concern  . Not on file   Social History Narrative  . No narrative on file      Review of Systems  All other systems reviewed and are negative.      Objective:   Physical Exam  Constitutional: He appears well-developed and well-nourished.  Cardiovascular: Normal rate, regular rhythm and normal heart sounds.   Pulmonary/Chest: Effort normal and breath sounds normal.  Musculoskeletal:       Right knee: He exhibits decreased range of motion. Tenderness found. Medial joint line and lateral joint line tenderness noted.       Cervical back: He exhibits decreased range of motion and tenderness.       Thoracic back: He exhibits decreased range of motion and tenderness.       Right hand: He exhibits decreased range of motion, tenderness and bony tenderness.  Vitals reviewed.         Assessment & Plan:  Ankylosing spondylitis of multiple sites in spine (HCC) - Plan: Drug Screen, Urine, COMPLETE  METABOLIC PANEL WITH GFR  Chronic prescription opiate use - Plan: Drug Screen, Urine, COMPLETE METABOLIC PANEL WITH GFR  Check urine drug screen to ensure compliance and rule out abuse or diversion. Continue to refill the patient's hydrocodone/acetaminophen 7.5/325 one by mouth every 6 hours. I will continue the patient 120 tablets every month unless urine drug screen shows an abnormality. I continue to encourage hepatitis C screening, prostate cancer screening, colonoscopy, and fasting lab work including a fasting lipid panel and he declines this due to cost. He does have an abnormal lesion on the bridge of his nose. It is a proximally 6 mm in diameter. It is covered in white scale and slightly depressed. 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  I recommended a dermatology consult but he declined at the present time despite my strong urging.

## 2016-06-09 LAB — DRUG ABUSE PANEL 10-50 NO CONF, U
AMPHETAMINES (1000 NG/ML SCRN): NEGATIVE
BARBITURATES: NEGATIVE
BENZODIAZEPINES: NEGATIVE
COCAINE METABOLITES: NEGATIVE
MARIJUANA MET (50 NG/ML SCRN): NEGATIVE
METHADONE: NEGATIVE
METHAQUALONE: NEGATIVE
OPIATES: POSITIVE — AB
PHENCYCLIDINE: NEGATIVE
PROPOXYPHENE: NEGATIVE

## 2016-06-10 ENCOUNTER — Encounter: Payer: Self-pay | Admitting: Family Medicine

## 2016-09-02 ENCOUNTER — Telehealth: Payer: Self-pay | Admitting: Family Medicine

## 2016-09-02 NOTE — Telephone Encounter (Signed)
ok 

## 2016-09-02 NOTE — Telephone Encounter (Signed)
Ok to refill 

## 2016-09-02 NOTE — Telephone Encounter (Signed)
Pt needs refill on hydrocodone.  °

## 2016-09-05 ENCOUNTER — Telehealth: Payer: Self-pay | Admitting: Family Medicine

## 2016-09-05 MED ORDER — HYDROCODONE-ACETAMINOPHEN 7.5-325 MG PO TABS: 1.0000 | ORAL_TABLET | Freq: Four times a day (QID) | ORAL | 0 refills | Status: DC | PRN

## 2016-09-05 NOTE — Telephone Encounter (Signed)
2 additional RX's printed to = months, left up front and patient will pick up tomorrow

## 2016-09-05 NOTE — Telephone Encounter (Signed)
New Message  Pt voiced he needs 3 months worth of medication instead of one due to his traveling.  Pt voiced he will be by to pick up medications tomorrow.  Please f/u

## 2016-09-05 NOTE — Telephone Encounter (Signed)
RX printed, left up front and patient aware to pick up  

## 2016-12-06 ENCOUNTER — Telehealth: Payer: Self-pay | Admitting: Family Medicine

## 2016-12-06 MED ORDER — HYDROCODONE-ACETAMINOPHEN 7.5-325 MG PO TABS: 1.0000 | ORAL_TABLET | Freq: Four times a day (QID) | ORAL | 0 refills | Status: DC | PRN

## 2016-12-06 NOTE — Telephone Encounter (Signed)
Patient requesting refill on hisr hydrocodone [phone]

## 2016-12-06 NOTE — Telephone Encounter (Signed)
ok 

## 2016-12-06 NOTE — Telephone Encounter (Signed)
Ok to refill 

## 2016-12-06 NOTE — Telephone Encounter (Signed)
RX printed x 3 months, left up front and patient aware to pick up  

## 2017-02-27 ENCOUNTER — Other Ambulatory Visit: Payer: Self-pay | Admitting: Family Medicine

## 2017-02-27 MED ORDER — HYDROCODONE-ACETAMINOPHEN 7.5-325 MG PO TABS: 1.0000 | ORAL_TABLET | Freq: Four times a day (QID) | ORAL | 0 refills | Status: DC | PRN

## 2017-02-27 NOTE — Telephone Encounter (Signed)
Med refill on hydrocodone sent to [facility]

## 2017-02-27 NOTE — Telephone Encounter (Signed)
Requesting refill      LOV: 03/11/16  LRF:  02/03/17

## 2017-04-05 ENCOUNTER — Other Ambulatory Visit: Payer: Self-pay | Admitting: Family Medicine

## 2017-04-05 NOTE — Telephone Encounter (Signed)
Patient is requesting a refill on Hydrocodone   LOV: 06/08/16  LRF:   02/27/17

## 2017-04-05 NOTE — Telephone Encounter (Signed)
Patient is calling to get refill on his hydrocodone  [facility]

## 2017-04-06 ENCOUNTER — Encounter: Payer: Self-pay | Admitting: Family Medicine

## 2017-04-06 MED ORDER — HYDROCODONE-ACETAMINOPHEN 7.5-325 MG PO TABS: 1.0000 | ORAL_TABLET | Freq: Four times a day (QID) | ORAL | 0 refills | Status: DC | PRN

## 2017-04-06 NOTE — Telephone Encounter (Signed)
Letter sent to pt to make apt.  

## 2017-04-06 NOTE — Telephone Encounter (Signed)
Needs ov q 6 months and uds.

## 2017-05-04 ENCOUNTER — Other Ambulatory Visit: Payer: Self-pay

## 2017-05-04 ENCOUNTER — Encounter: Payer: Self-pay | Admitting: Family Medicine

## 2017-05-04 ENCOUNTER — Ambulatory Visit: Payer: Medicaid Other | Admitting: Family Medicine

## 2017-05-04 VITALS — BP 140/80 | HR 68 | Temp 98.0°F | Resp 16 | Ht 71.0 in | Wt 252.0 lb

## 2017-05-04 DIAGNOSIS — E7800 Pure hypercholesterolemia, unspecified: Secondary | ICD-10-CM | POA: Diagnosis not present

## 2017-05-04 DIAGNOSIS — L989 Disorder of the skin and subcutaneous tissue, unspecified: Secondary | ICD-10-CM

## 2017-05-04 DIAGNOSIS — M450 Ankylosing spondylitis of multiple sites in spine: Secondary | ICD-10-CM

## 2017-05-04 DIAGNOSIS — Z79891 Long term (current) use of opiate analgesic: Secondary | ICD-10-CM | POA: Diagnosis not present

## 2017-05-04 DIAGNOSIS — Z125 Encounter for screening for malignant neoplasm of prostate: Secondary | ICD-10-CM | POA: Diagnosis not present

## 2017-05-04 NOTE — Progress Notes (Signed)
Subjective:    Patient ID: Joshua Kramer, male    DOB: 05/21/1955, 62 y.o.   MRN: [id number]  Medication Refill   10/30/14 Patient has a history of ankylosing spondylitis. He is tried numerous biologic agents to prevent exacerbations of this under the care of a rheumatologist. He failed many of them either due to cost or secondary infections. He was unable to tolerate enbrel or humira.  He tried sulfasalazine in the past without any benefit. He is currently using Norco 7.5/325 one 4 times a day. He states that his pain is getting worse. He continues to have severe pain in his lower back, in his hands, and in his knees. He has very little range of motion in his neck. His neck is essentially fused. He must turn his entire torso to look laterally. He has inflammatory swelling in the DIP joint on his right fourth digit.  He is interested in increasing the frequency of his pain medication.  AT that time, my plan was: I am fine refilling the patient's Norco. He can continue to take this 4 times a day. I will give him 120 tablets with 2 refills. Patient has no insurance and therefore he cannot afford the biologic agents. He cannot afford lab work. However I insisted that we get a CMP to monitor his kidney/liver function and blood sugar. I will also start the patient on prednisone 10 mg a day empirically for one month to see if his quality of life improves. If it does, we may want to consider continuing this long-term and then have a discussion regarding the side effects that prolonged glucocorticoid use can cause.  05/29/15 Patient is here today requesting a refill on his pain medication. There is no evidence of abuse or diversion. Unfortunately the patient still does not have any health insurance. Therefore he still has not had a colonoscopy. He is overdue for a digital rectal exam and PSA. He is also overdue for fasting lipid panel. Based on his age, he would also qualify for hepatitis C screening. Based  on his age he will be due for the shingles vaccine. I have recommended all these things to the patient today. Obviously cost is a concern. He is interested in applying for disability and possibly Medicare/Medicaid. We had a discussion today about the process for doing this.  At that time, my plan was: I refilled the patient's pain medication for a total of 3 months. There is no evidence of abuse or diversion. I recommended a colonoscopy, I recommended a PSA. I recommended a fasting lipid panel. I recommended a shingles vaccine. I recommended hepatitis C screening. The patient believes he will come back to have his cholesterol checked. I am fine with that. He would like to do this one of the time to make it more affordable which I certainly understand. He is also going to inquire about disability with social services  06/08/16 The patient has recently applied for disability. He anticipates hearing a result by August.  He is unable to work and is in constant pain. He has significant pain primarily in his neck lower back knees and feet. He has to take hydrocodone every 6 hours. He declines a CMP today to monitor his liver function tests because he cannot afford it. He begrudgingly consents to urine drug screen because I said that this was mandatory. He continues to not have health insurance. Therefore he continues to refuse a colonoscopy, a prostate exam, or hepatitis C screening.  He also declines a screening cholesterol level.  At that time, my plan was: Check urine drug screen to ensure compliance and rule out abuse or diversion. Continue to refill the patient's hydrocodone/acetaminophen 7.5/325 one by mouth every 6 hours. I will continue the patient 120 tablets every month unless urine drug screen shows an abnormality. I continue to encourage hepatitis C screening, prostate cancer screening, colonoscopy, and fasting lab work including a fasting lipid panel and he declines this due to cost. He does have an  abnormal lesion on the bridge of his nose. It is a proximally 6 mm in diameter. It is covered in white scale and slightly depressed. 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 I recommended a dermatology consult but he declined at the present time despite my strong urging.    05/04/17 Patient is here today at my request in order to continue to refill his pain medication which he takes for chronic disabling pain due to ankylosing spondylitis in his cervical spine, thoracic spine, and lumbar spine.  He also has bilateral knee pain as well as pain in both feet secondary to osteoarthritis.  However most fortunately, the patient now has health insurance.  He continues to have a lesion on the bridge of his nose suspicious for squamous cell cancer.  There is also a lesion on his forehead.  Patient is now willing to see a dermatologist for treatment of these.  I will happily arrange that.  He is also requesting a referral to a rheumatologist now that he has health insurance to discuss options for management of his ankylosing spondylitis including the new biologic agents that are available.  I believe this is entirely appropriate.  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  He is noticed some mild blurry vision and would like to reestablish with an ophthalmologist.  He is overdue for prostate cancer screening.  He is due for a colonoscopy.  He is due for routine lab work.  He is due for HIV and hepatitis C screening.  He declines HIV and hepatitis C screening.  He defers a colonoscopy at the present time.  However he would like to be screened for prostate cancer as he reports weak urinary stream Past Medical History:  Diagnosis Date  . Ankylosing spondylitis of multiple sites in spine (HCC)   . Chest pain 05/2015   a. abnormal nuc stress test with normal cors and LV function on subsequent cath  . History of tobacco abuse    Past Surgical History:  Procedure Laterality Date    . CARDIAC CATHETERIZATION  2011   nl cors, nl EF  . CARDIAC CATHETERIZATION N/A 06/02/2015   Procedure: Left Heart Cath and Coronary Angiography;  Surgeon: Lennette Bihari, MD;  Location: [facility] INVASIVE CV LAB;  Service: Cardiovascular;  Laterality: N/A;   Current Outpatient Medications on File Prior to Visit  Medication Sig Dispense Refill  . HYDROcodone-acetaminophen (NORCO) 7.5-325 MG tablet Take 1 tablet by mouth every 6 (six) hours as needed. 120 tablet 0  . Thiamine HCl (VITAMIN B-1) 250 MG tablet Take 250 mg by mouth daily.    . vitamin E (VITAMIN E) 400 UNIT capsule Take 400 Units by mouth daily.    Marland Kitchen atorvastatin (LIPITOR) 40 MG tablet Take 1 tablet (40 mg total) by mouth daily at 6 PM. (Patient not taking: Reported on 06/08/2016) 90 tablet 3   No current facility-administered medications on file prior to visit.  No Known Allergies Social History   Socioeconomic History  . Marital status: Married    Spouse name: Not on file  . Number of children: 1  . Years of education: Not on file  . Highest education level: Not on file  Occupational History  . Occupation: Airline pilot  Social Needs  . Financial resource strain: Not on file  . Food insecurity:    Worry: Not on file    Inability: Not on file  . Transportation needs:    Medical: Not on file    Non-medical: Not on file  Tobacco Use  . Smoking status: Former Smoker    Last attempt to quit: 06/18/2014    Years since quitting: 2.8  . Smokeless tobacco: Never Used  Substance and Sexual Activity  . Alcohol use: No    Alcohol/week: 0.0 oz  . Drug use: No  . Sexual activity: Yes  Lifestyle  . Physical activity:    Days per week: Not on file    Minutes per session: Not on file  . Stress: Not on file  Relationships  . Social connections:    Talks on phone: Not on file    Gets together: Not on file    Attends religious service: Not on file    Active member of club or organization: Not on file    Attends meetings of clubs or  organizations: Not on file    Relationship status: Not on file  . Intimate partner violence:    Fear of current or ex partner: Not on file    Emotionally abused: Not on file    Physically abused: Not on file    Forced sexual activity: Not on file  Other Topics Concern  . Not on file  Social History Narrative  . Not on file      Review of Systems  All other systems reviewed and are negative.      Objective:   Physical Exam  Constitutional: He appears well-developed and well-nourished.  Cardiovascular: Normal rate, regular rhythm and normal heart sounds.  Pulmonary/Chest: Effort normal and breath sounds normal.  Musculoskeletal:       Right knee: He exhibits decreased range of motion. Tenderness found. Medial joint line and lateral joint line tenderness noted.       Cervical back: He exhibits decreased range of motion and tenderness.       Thoracic back: He exhibits decreased range of motion and tenderness.       Right hand: He exhibits decreased range of motion, tenderness and bony tenderness.  Vitals reviewed.         Assessment & Plan:  Ankylosing spondylitis of multiple sites in spine (HCC) - Plan: Drugs of abuse screen w/o alc, rtn urine-sln, COMPLETE METABOLIC PANEL WITH GFR, Ambulatory referral to Rheumatology, Ambulatory referral to Ophthalmology  Chronic prescription opiate use - Plan: Drugs of abuse screen w/o alc, rtn urine-sln, COMPLETE METABOLIC PANEL WITH GFR  Lesion of skin of face - Plan: Ambulatory referral to Dermatology  Prostate cancer screening - Plan: PSA  Pure hypercholesterolemia - Plan: Lipid panel  Patient agrees to a urine drug screen to ensure compliance and rule out abuse or diversion.  I have no concerns about this patient having any illicit behavior however for documentation sake will perform a urine drug screen today.  I will now check a CBC and a CMP to monitor for any signs of anemia, bone marrow abnormalities, liver and kidney function  test, as well as blood sugar.  Check fasting lipid panel to monitor his hyperlipidemia.  Check PSA to screen for prostate cancer.  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  Schedule the patient to see a rheumatologist for follow-up of his ankylosing spondylitis.  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  Recommended a colonoscopy but the patient defers at the present time.

## 2017-05-05 LAB — LIPID PANEL
CHOL/HDL RATIO: 5.5 (calc) — AB (ref <5.0)
CHOLESTEROL: 192 mg/dL (ref <200)
HDL: 35 mg/dL — ABNORMAL LOW (ref >40)
LDL CHOLESTEROL (CALC): 135 mg/dL — AB
Non-HDL Cholesterol (Calc): 157 mg/dL (calc) — ABNORMAL HIGH (ref <130)
Triglycerides: 113 mg/dL (ref <150)

## 2017-05-05 LAB — COMPLETE METABOLIC PANEL WITH GFR
AG Ratio: 1.7 (calc) (ref 1.0–2.5)
ALBUMIN MSPROF: 4.2 g/dL (ref 3.6–5.1)
ALT: 14 U/L (ref 9–46)
AST: 12 U/L (ref 10–35)
Alkaline phosphatase (APISO): 75 U/L (ref 40–115)
BUN: 15 mg/dL (ref 7–25)
CALCIUM: 9.1 mg/dL (ref 8.6–10.3)
CO2: 28 mmol/L (ref 20–32)
CREATININE: 0.79 mg/dL (ref 0.70–1.25)
Chloride: 104 mmol/L (ref 98–110)
GFR, EST AFRICAN AMERICAN: 112 mL/min/{1.73_m2} (ref >=60)
GFR, Est Non African American: 97 mL/min/{1.73_m2} (ref >=60)
Globulin: 2.5 g/dL (calc) (ref 1.9–3.7)
Glucose, Bld: 110 mg/dL — ABNORMAL HIGH (ref 65–99)
Potassium: 4.2 mmol/L (ref 3.5–5.3)
Sodium: 138 mmol/L (ref 135–146)
TOTAL PROTEIN: 6.7 g/dL (ref 6.1–8.1)
Total Bilirubin: 0.8 mg/dL (ref 0.2–1.2)

## 2017-05-05 LAB — EXTRA LAV TOP TUBE

## 2017-05-05 LAB — PSA: PSA: 0.3 ng/mL (ref <=4.0)

## 2017-05-08 ENCOUNTER — Other Ambulatory Visit: Payer: Self-pay | Admitting: Family Medicine

## 2017-05-08 ENCOUNTER — Telehealth: Payer: Self-pay | Admitting: Family Medicine

## 2017-05-08 ENCOUNTER — Encounter (INDEPENDENT_AMBULATORY_CARE_PROVIDER_SITE_OTHER): Payer: Self-pay

## 2017-05-08 LAB — DRUGS OF ABUSE SCREEN W/O ALC, ROUTINE URINE
AMPHETAMINES (1000 NG/ML SCRN): NEGATIVE
BARBITURATES: NEGATIVE
BENZODIAZEPINES: NEGATIVE
COCAINE METABOLITES: NEGATIVE
HYDROCODONE: POSITIVE — AB
HYDROMORPHONE: POSITIVE — AB
MARIJUANA MET (50 ng/mL SCRN): NEGATIVE
METHADONE: NEGATIVE
METHAQUALONE: NEGATIVE
OPIATES: POSITIVE — AB
PHENCYCLIDINE: NEGATIVE
PROPOXYPHENE: NEGATIVE

## 2017-05-08 MED ORDER — HYDROCODONE-ACETAMINOPHEN 7.5-325 MG PO TABS: 1.0000 | ORAL_TABLET | Freq: Four times a day (QID) | ORAL | 0 refills | Status: DC | PRN

## 2017-05-08 NOTE — Telephone Encounter (Signed)
Spoke with patient to inform him of his referral. While on the phone patient asked if he could get a refill on his Hydrocodone. Please advise?

## 2017-05-10 ENCOUNTER — Other Ambulatory Visit: Payer: Self-pay | Admitting: Family Medicine

## 2017-05-10 MED ORDER — ATORVASTATIN CALCIUM 40 MG PO TABS: 40.0000 mg | ORAL_TABLET | Freq: Every day | ORAL | 3 refills | Status: DC

## 2017-06-07 ENCOUNTER — Telehealth: Payer: Self-pay | Admitting: Family Medicine

## 2017-06-07 NOTE — Telephone Encounter (Signed)
Patient called requesting a refill on his Hydrocodone. Please advise?

## 2017-06-08 ENCOUNTER — Other Ambulatory Visit: Payer: Self-pay | Admitting: Family Medicine

## 2017-06-08 MED ORDER — HYDROCODONE-ACETAMINOPHEN 7.5-325 MG PO TABS: 1.0000 | ORAL_TABLET | Freq: Four times a day (QID) | ORAL | 0 refills | Status: DC | PRN

## 2017-07-05 ENCOUNTER — Other Ambulatory Visit: Payer: Self-pay | Admitting: Family Medicine

## 2017-07-05 NOTE — Telephone Encounter (Signed)
Patient is requesting a refill on Hydrocodone   LOV: 05/04/17  LRF:   06/08/17

## 2017-07-06 ENCOUNTER — Telehealth: Payer: Self-pay | Admitting: Family Medicine

## 2017-07-06 MED ORDER — HYDROCODONE-ACETAMINOPHEN 7.5-325 MG PO TABS: 1.0000 | ORAL_TABLET | Freq: Four times a day (QID) | ORAL | 0 refills | Status: DC | PRN

## 2017-07-06 NOTE — Telephone Encounter (Signed)
PA submitted and approved.   PA [id number] approved x180 days.   Pharmacy made aware.

## 2017-07-06 NOTE — Telephone Encounter (Signed)
Prior Authorization for MCD

## 2017-08-02 ENCOUNTER — Other Ambulatory Visit: Payer: Self-pay | Admitting: Family Medicine

## 2017-08-02 NOTE — Telephone Encounter (Signed)
Patient is requesting a refill on Hydrocodone   LOV: 05/04/17  LRF:   07/06/2017

## 2017-08-03 MED ORDER — HYDROCODONE-ACETAMINOPHEN 7.5-325 MG PO TABS: 1.0000 | ORAL_TABLET | Freq: Four times a day (QID) | ORAL | 0 refills | Status: DC | PRN

## 2017-08-30 ENCOUNTER — Other Ambulatory Visit: Payer: Self-pay | Admitting: Family Medicine

## 2017-08-30 NOTE — Telephone Encounter (Signed)
Patient is requesting a refill on Hydrocodone   LOV: 05/04/17  LRF:   08/03/17

## 2017-08-31 MED ORDER — HYDROCODONE-ACETAMINOPHEN 7.5-325 MG PO TABS: 1.0000 | ORAL_TABLET | Freq: Four times a day (QID) | ORAL | 0 refills | Status: DC | PRN

## 2017-10-02 ENCOUNTER — Other Ambulatory Visit: Payer: Self-pay | Admitting: Family Medicine

## 2017-10-02 MED ORDER — HYDROCODONE-ACETAMINOPHEN 7.5-325 MG PO TABS: 1.0000 | ORAL_TABLET | Freq: Four times a day (QID) | ORAL | 0 refills | Status: DC | PRN

## 2017-10-02 NOTE — Telephone Encounter (Signed)
Refill on hydrocodone to [facility] [address]

## 2017-10-02 NOTE — Telephone Encounter (Signed)
Patient is requesting a refill on Hydrocodone   LOV: 05/04/17  LRF:   08/31/17

## 2017-10-19 IMAGING — DX DG CHEST 2V
2 series · 2 of 2 positions shown · non-contrast
Comparison: Prior study from 06/11/2010

CLINICAL DATA: Initial valuation for acute central chest pain.

EXAM:
CHEST  2 VIEW

[chest pa]
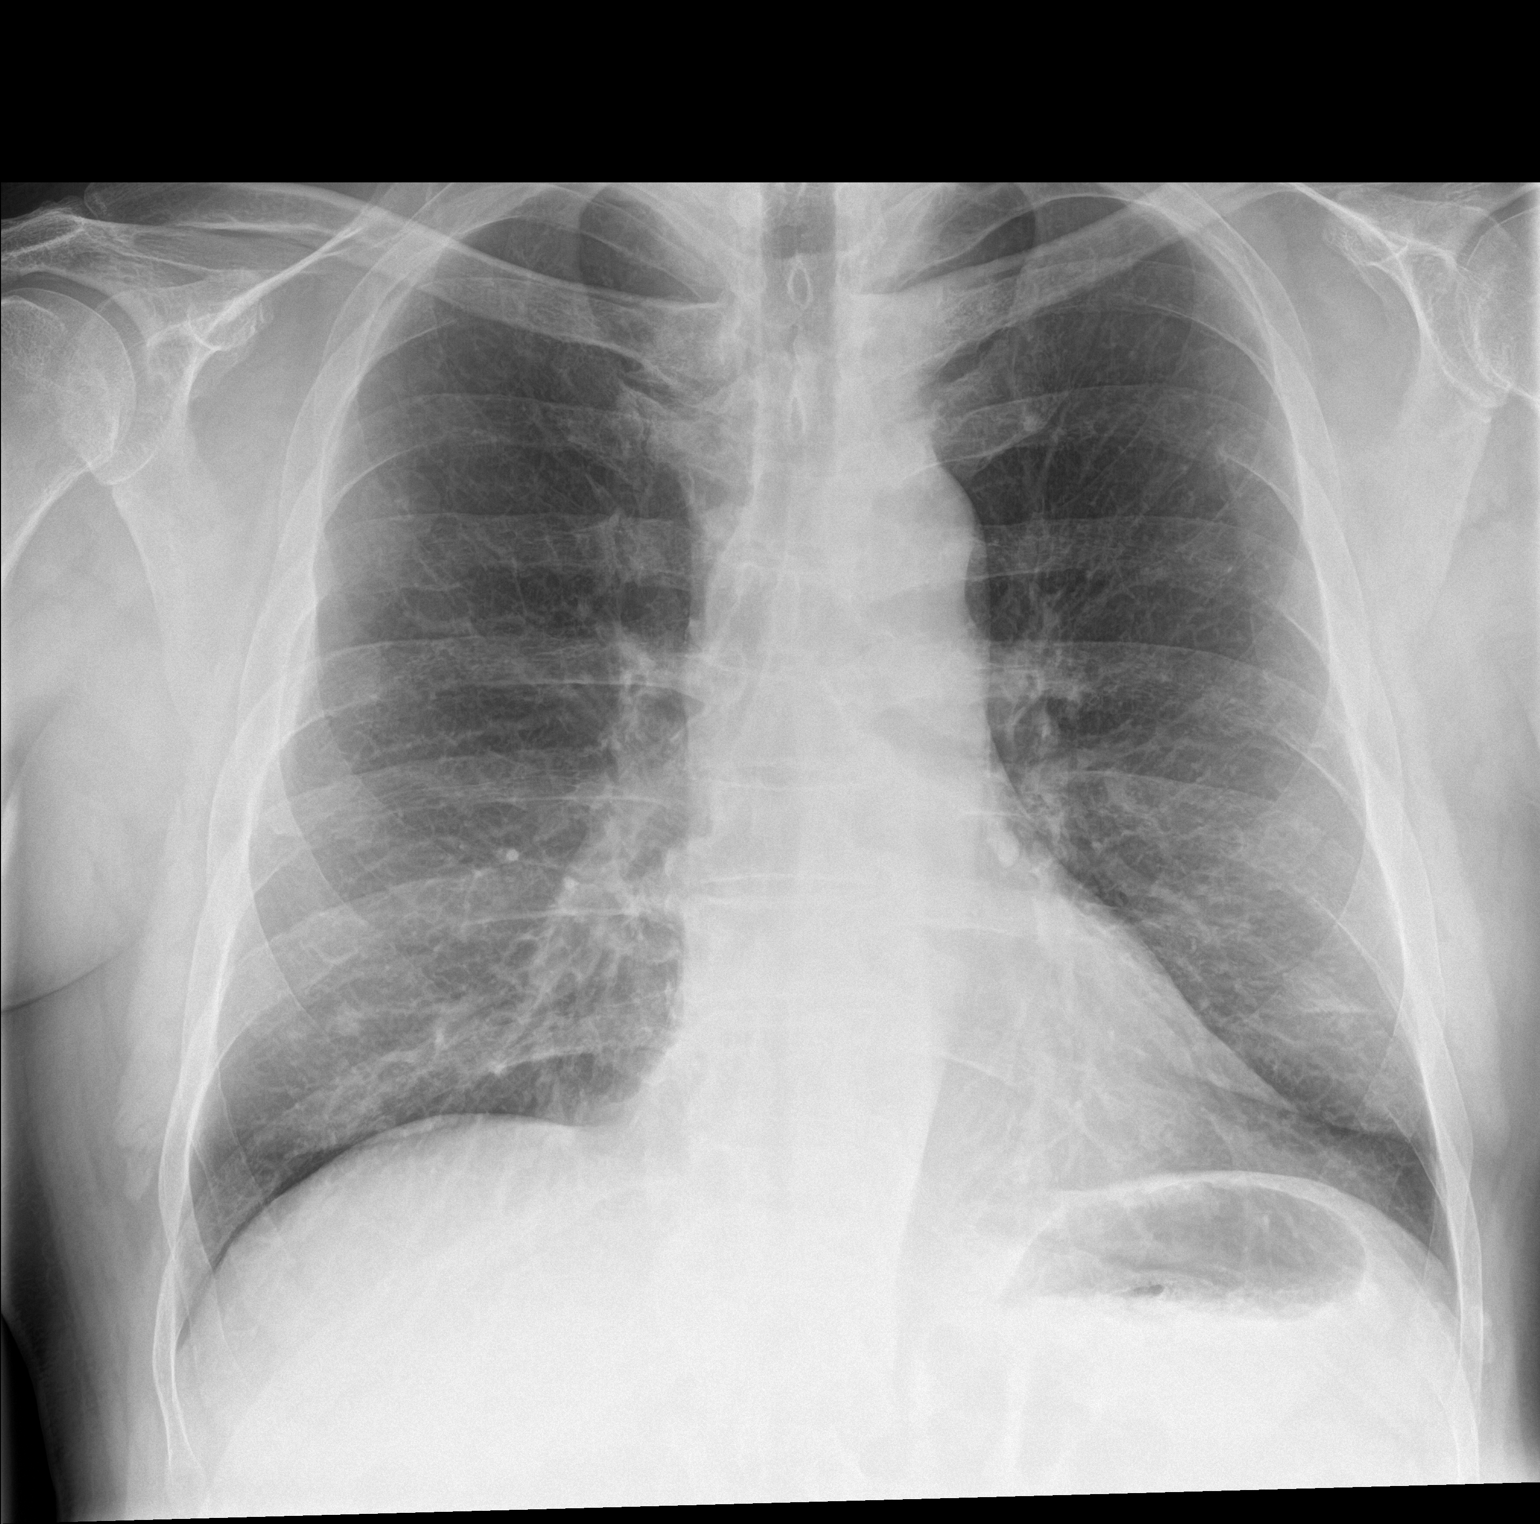

[chest lat]
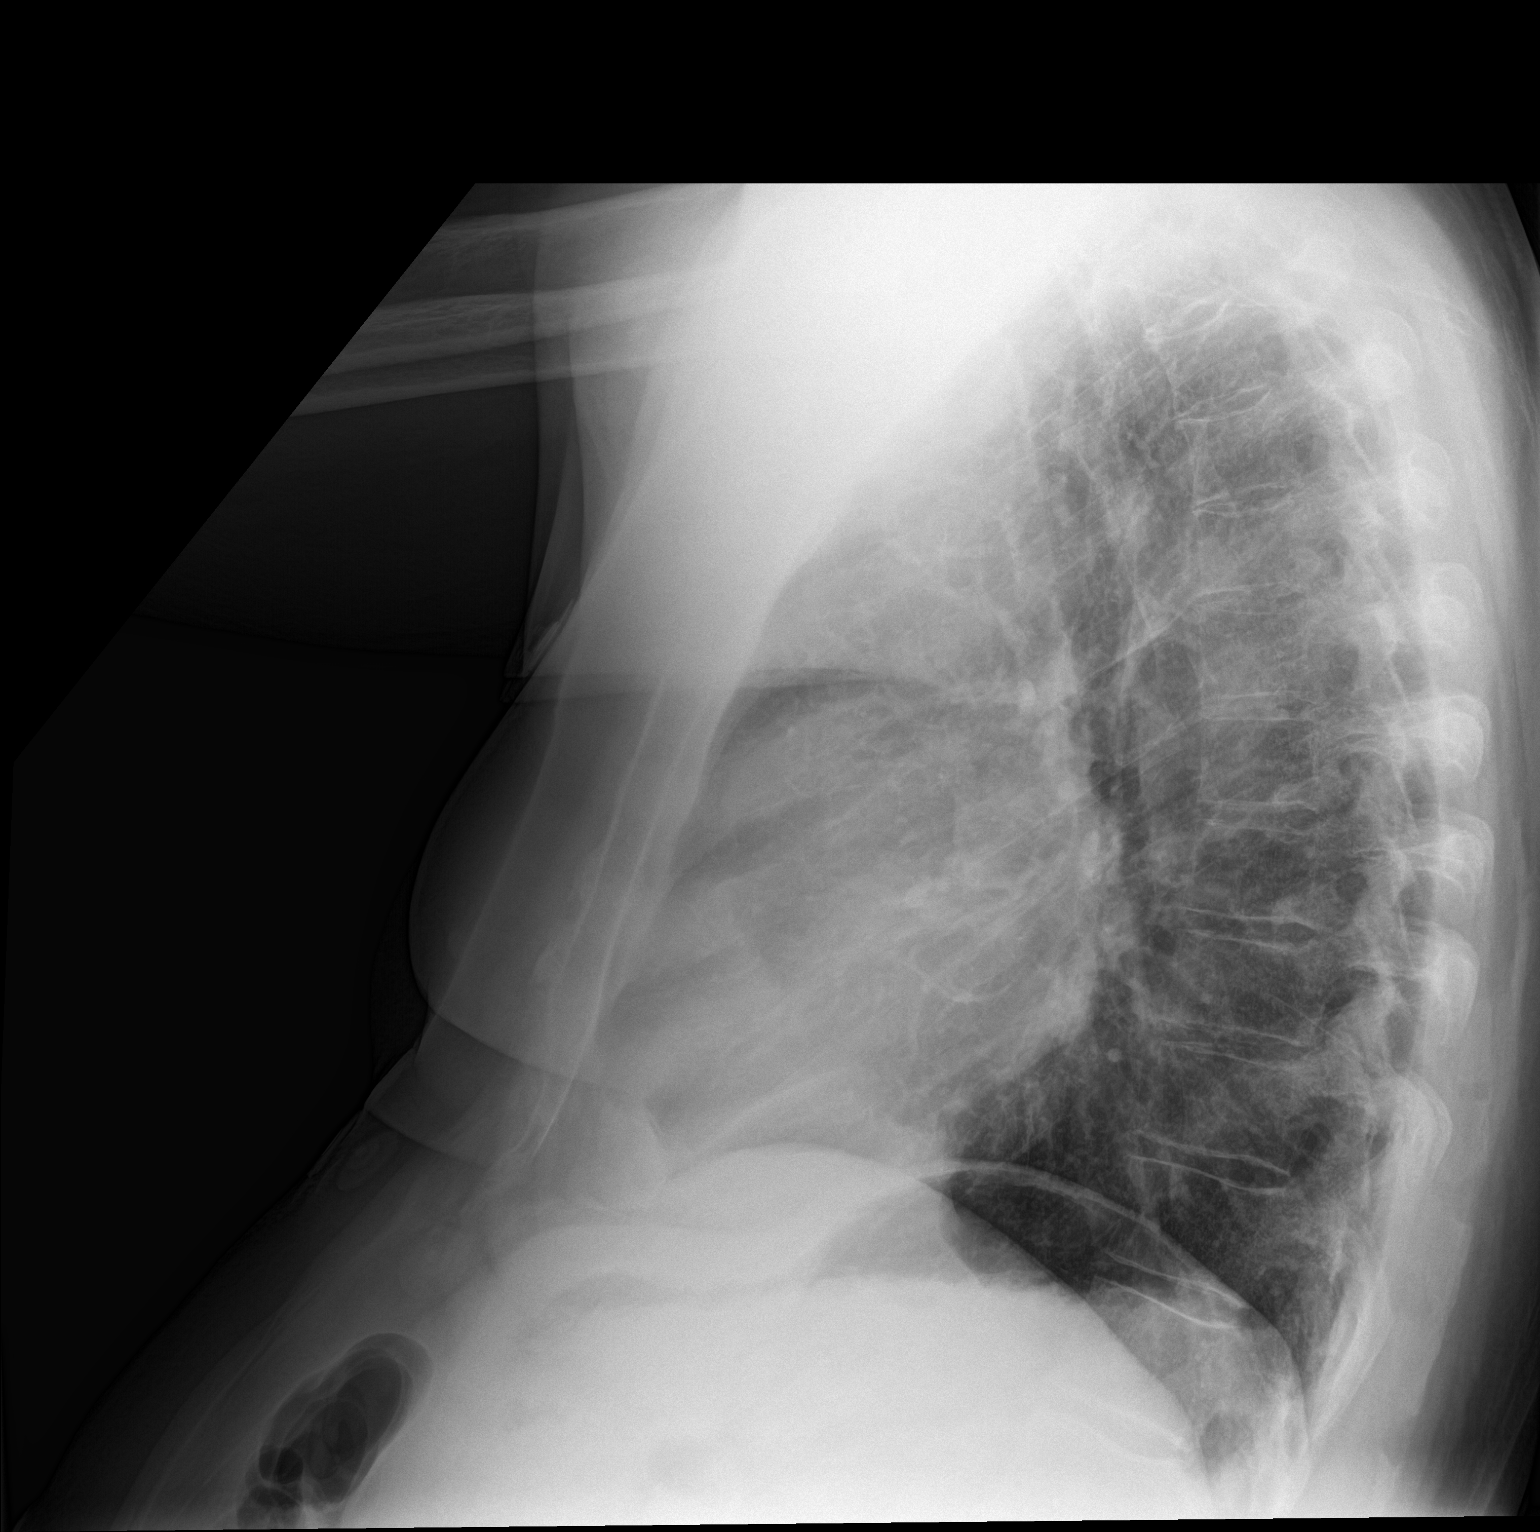

[2 of 2 positions shown; findings below may reference images not displayed]

FINDINGS: The cardiac and mediastinal silhouettes are stable in size and
contour, and remain within normal limits.

The lungs are normally inflated. Mild diffuse bronchitic changes,
similar to prior. No airspace consolidation, pleural effusion, or
pulmonary edema is identified. There is no pneumothorax.

No acute osseous abnormality identified.
IMPRESSION: 1. No active cardiopulmonary disease.
2. Mild diffuse bronchitic changes, similar to prior.

## 2017-11-01 ENCOUNTER — Other Ambulatory Visit: Payer: Self-pay | Admitting: Family Medicine

## 2017-11-01 NOTE — Telephone Encounter (Signed)
Patient is requesting a refill on Hydrocodone   LOV: 05/04/17  LRF: 10/02/17

## 2017-11-02 MED ORDER — HYDROCODONE-ACETAMINOPHEN 7.5-325 MG PO TABS: 1.0000 | ORAL_TABLET | Freq: Four times a day (QID) | ORAL | 0 refills | Status: DC | PRN

## 2017-11-29 ENCOUNTER — Other Ambulatory Visit: Payer: Self-pay | Admitting: Family Medicine

## 2017-11-29 NOTE — Telephone Encounter (Signed)
Patient is requesting a refill on Hydrocodone   LOV: 05/04/17  LRF:   11/02/17

## 2017-11-29 NOTE — Telephone Encounter (Signed)
[facility] [address]  Patient calling to get refill on his hydrocodone  [phone]

## 2017-11-30 MED ORDER — HYDROCODONE-ACETAMINOPHEN 7.5-325 MG PO TABS: 1.0000 | ORAL_TABLET | Freq: Four times a day (QID) | ORAL | 0 refills | Status: DC | PRN

## 2017-12-27 ENCOUNTER — Other Ambulatory Visit: Payer: Self-pay | Admitting: Family Medicine

## 2017-12-27 NOTE — Telephone Encounter (Signed)
Patient requesting refill on his hydrocodone  To be sent to [facility] [address]

## 2017-12-28 MED ORDER — HYDROCODONE-ACETAMINOPHEN 7.5-325 MG PO TABS: 1.0000 | ORAL_TABLET | Freq: Four times a day (QID) | ORAL | 0 refills | Status: DC | PRN

## 2017-12-28 NOTE — Telephone Encounter (Signed)
Ok to refill??  Last office visit 05/04/2017.  Last refill 11/30/2017.

## 2017-12-28 NOTE — Telephone Encounter (Signed)
Can you re-send as pharmacy cannot accept faxed copy.

## 2017-12-28 NOTE — Addendum Note (Signed)
Addended by: Phillips OdorSIX, Othello Sgroi H on: 12/28/2017 12:33 PM   Modules accepted: Orders

## 2018-01-02 ENCOUNTER — Telehealth: Payer: Self-pay | Admitting: Family Medicine

## 2018-01-02 NOTE — Telephone Encounter (Signed)
Pt lvm stating pharmacy is requesting new pa for his pain medication please call him back because he is on edge.

## 2018-01-02 NOTE — Telephone Encounter (Signed)
Received request from pharmacy for PA on Hydrocodone/ APAP.   PA submitted. Confirmation # [id number] W.  Dx: M45.0- ankylosing spondylitis

## 2018-01-04 NOTE — Telephone Encounter (Signed)
Received PA determination.   PA [id number] approved 01/02/2018 - 07/01/2018.  Pharmacy made aware.

## 2018-01-26 ENCOUNTER — Ambulatory Visit: Payer: Medicaid Other | Admitting: Family Medicine

## 2018-01-26 ENCOUNTER — Encounter: Payer: Self-pay | Admitting: Family Medicine

## 2018-01-26 VITALS — BP 126/78 | HR 70 | Temp 98.4°F | Resp 18 | Ht 73.0 in | Wt 258.0 lb

## 2018-01-26 DIAGNOSIS — E7800 Pure hypercholesterolemia, unspecified: Secondary | ICD-10-CM | POA: Diagnosis not present

## 2018-01-26 DIAGNOSIS — M450 Ankylosing spondylitis of multiple sites in spine: Secondary | ICD-10-CM

## 2018-01-26 DIAGNOSIS — Z1211 Encounter for screening for malignant neoplasm of colon: Secondary | ICD-10-CM | POA: Diagnosis not present

## 2018-01-26 DIAGNOSIS — Z79891 Long term (current) use of opiate analgesic: Secondary | ICD-10-CM | POA: Diagnosis not present

## 2018-01-26 MED ORDER — VALACYCLOVIR HCL 1 G PO TABS: 1000.0000 mg | ORAL_TABLET | Freq: Three times a day (TID) | ORAL | 0 refills | Status: DC

## 2018-01-26 MED ORDER — GABAPENTIN 300 MG PO CAPS: 300.0000 mg | ORAL_CAPSULE | Freq: Three times a day (TID) | ORAL | 0 refills | Status: DC

## 2018-01-26 NOTE — Progress Notes (Signed)
Subjective:    Patient ID: Joshua Kramer, male    DOB: June 27, 1955, 63 y.o.   MRN: [id number]  HPI Patient is here today reporting a rash on his left flank.  Began approximately 2 days ago.  Rash consist of erythematous papules and vesicles and blisters grouped in a linear fashion radiating from his left lower back around in a dermatomal pattern to his lower abdomen.  It itches and burns.  Presentation is consistent with shingles.  He is also hoping why he is here that he can have a checkup for his medication.  He is on chronic opiates for his chronic low back pain. Patient has a history of ankylosing spondylitis. He is tried numerous biologic agents to prevent exacerbations of this under the care of a rheumatologist. He failed many of them either due to cost or secondary infections. He was unable to tolerate enbrel or humira.  He tried sulfasalazine in the past without any benefit. He is currently using Norco 7.5/325 one 4 times a day.  He is overdue for colonoscopy.  We discussed this today and he agrees to allow me to schedule this.  He is also on Lipitor for hyperlipidemia.  He denies any myalgias or right upper quadrant pain.  He is due for fasting lipid panel.  I am glad to hear but the patient states that he is actually quit smoking.  I congratulated him on this. Past Medical History:  Diagnosis Date  . Ankylosing spondylitis of multiple sites in spine (HCC)   . Chest pain 05/2015   a. abnormal nuc stress test with normal cors and LV function on subsequent cath  . History of tobacco abuse    Past Surgical History:  Procedure Laterality Date  . CARDIAC CATHETERIZATION  2011   nl cors, nl EF  . CARDIAC CATHETERIZATION N/A 06/02/2015   Procedure: Left Heart Cath and Coronary Angiography;  Surgeon: Lennette Bihari, MD;  Location: [facility] INVASIVE CV LAB;  Service: Cardiovascular;  Laterality: N/A;   Current Outpatient Medications on File Prior to Visit  Medication Sig Dispense Refill  .  atorvastatin (LIPITOR) 40 MG tablet Take 1 tablet (40 mg total) by mouth daily. 90 tablet 3  . HYDROcodone-acetaminophen (NORCO) 7.5-325 MG tablet Take 1 tablet by mouth every 6 (six) hours as needed. 120 tablet 0  . Thiamine HCl (VITAMIN B-1) 250 MG tablet Take 250 mg by mouth daily.    . vitamin E (VITAMIN E) 400 UNIT capsule Take 400 Units by mouth daily.     No current facility-administered medications on file prior to visit.    No Known Allergies Social History   Socioeconomic History  . Marital status: Married    Spouse name: Not on file  . Number of children: 1  . Years of education: Not on file  . Highest education level: Not on file  Occupational History  . Occupation: Airline pilot  Social Needs  . Financial resource strain: Not on file  . Food insecurity:    Worry: Not on file    Inability: Not on file  . Transportation needs:    Medical: Not on file    Non-medical: Not on file  Tobacco Use  . Smoking status: Former Smoker    Last attempt to quit: 06/18/2014    Years since quitting: 3.6  . Smokeless tobacco: Never Used  Substance and Sexual Activity  . Alcohol use: No    Alcohol/week: 0.0 standard drinks  . Drug use: No  . Sexual  activity: Yes  Lifestyle  . Physical activity:    Days per week: Not on file    Minutes per session: Not on file  . Stress: Not on file  Relationships  . Social connections:    Talks on phone: Not on file    Gets together: Not on file    Attends religious service: Not on file    Active member of club or organization: Not on file    Attends meetings of clubs or organizations: Not on file    Relationship status: Not on file  . Intimate partner violence:    Fear of current or ex partner: Not on file    Emotionally abused: Not on file    Physically abused: Not on file    Forced sexual activity: Not on file  Other Topics Concern  . Not on file  Social History Narrative  . Not on file      Review of Systems  All other systems  reviewed and are negative.      Objective:   Physical Exam Vitals signs reviewed.  Constitutional:      General: He is not in acute distress.    Appearance: Normal appearance. He is not ill-appearing or toxic-appearing.  Cardiovascular:     Rate and Rhythm: Normal rate and regular rhythm.     Heart sounds: Normal heart sounds. No murmur.  Pulmonary:     Effort: Pulmonary effort is normal. No respiratory distress.     Breath sounds: Normal breath sounds. No stridor. No wheezing, rhonchi or rales.  Musculoskeletal:     Thoracic back: He exhibits decreased range of motion, tenderness and pain.     Lumbar back: He exhibits decreased range of motion, tenderness and pain.       Back:  Neurological:     Mental Status: He is alert.           Assessment & Plan:  Ankylosing spondylitis of multiple sites in spine (HCC) - Plan: Drugs of abuse screen w/o alc, rtn urine-sln  Pure hypercholesterolemia - Plan: CBC with Differential/Platelet, COMPLETE METABOLIC PANEL WITH GFR, Lipid panel  Chronic prescription opiate use  Patient appears to have shingles.  Begin Valtrex 1 g p.o. 3 times daily for 7 days.  Use gabapentin 300 mg p.o. 3 times daily for additional neuropathic pain.  Congratulated the patient on his smoking cessation.  We will continue to prescribe Norco as prescribed 4 times a day.  However I will obtain a urine drug screen today to confirm adherence and rule out drugs of abuse.  We will also check a CBC, CMP, and a fasting lipid panel.  We will schedule the patient for colonoscopy as we discussed

## 2018-01-29 ENCOUNTER — Other Ambulatory Visit: Payer: Self-pay | Admitting: Family Medicine

## 2018-01-29 ENCOUNTER — Telehealth: Payer: Self-pay | Admitting: Family Medicine

## 2018-01-29 MED ORDER — HYDROCODONE-ACETAMINOPHEN 7.5-325 MG PO TABS: 1.0000 | ORAL_TABLET | Freq: Four times a day (QID) | ORAL | 0 refills | Status: DC | PRN

## 2018-01-29 NOTE — Telephone Encounter (Signed)
Please see result note. This call was opened in error.

## 2018-01-29 NOTE — Telephone Encounter (Signed)
Patient called in requesting a refill on his Hydrocodone. He stated that it will be due on 02/01/2018. It was last filled on 12/28/2017

## 2018-02-01 LAB — CBC WITH DIFFERENTIAL/PLATELET
Absolute Monocytes: 592 cells/uL (ref 200–950)
Basophils Absolute: 37 cells/uL (ref 0–200)
Basophils Relative: 0.5 %
EOS PCT: 0.8 %
Eosinophils Absolute: 59 cells/uL (ref 15–500)
HCT: 44.3 % (ref 38.5–50.0)
Hemoglobin: 15.2 g/dL (ref 13.2–17.1)
Lymphs Abs: 1628 cells/uL (ref 850–3900)
MCH: 30.3 pg (ref 27.0–33.0)
MCHC: 34.3 g/dL (ref 32.0–36.0)
MCV: 88.4 fL (ref 80.0–100.0)
MONOS PCT: 8 %
MPV: 9.5 fL (ref 7.5–12.5)
NEUTROS PCT: 68.7 %
Neutro Abs: 5084 cells/uL (ref 1500–7800)
PLATELETS: 267 10*3/uL (ref 140–400)
RBC: 5.01 10*6/uL (ref 4.20–5.80)
RDW: 12.9 % (ref 11.0–15.0)
TOTAL LYMPHOCYTE: 22 %
WBC: 7.4 10*3/uL (ref 3.8–10.8)

## 2018-02-01 LAB — COMPLETE METABOLIC PANEL WITH GFR
AG Ratio: 1.4 (calc) (ref 1.0–2.5)
ALKALINE PHOSPHATASE (APISO): 76 U/L (ref 40–115)
ALT: 16 U/L (ref 9–46)
AST: 13 U/L (ref 10–35)
Albumin: 4 g/dL (ref 3.6–5.1)
BUN: 14 mg/dL (ref 7–25)
CO2: 28 mmol/L (ref 20–32)
CREATININE: 0.82 mg/dL (ref 0.70–1.25)
Calcium: 9.2 mg/dL (ref 8.6–10.3)
Chloride: 105 mmol/L (ref 98–110)
GFR, EST NON AFRICAN AMERICAN: 95 mL/min/{1.73_m2} (ref >=60)
GFR, Est African American: 110 mL/min/{1.73_m2} (ref >=60)
GLUCOSE: 84 mg/dL (ref 65–99)
Globulin: 2.8 g/dL (calc) (ref 1.9–3.7)
Potassium: 4.7 mmol/L (ref 3.5–5.3)
Sodium: 139 mmol/L (ref 135–146)
Total Bilirubin: 0.7 mg/dL (ref 0.2–1.2)
Total Protein: 6.8 g/dL (ref 6.1–8.1)

## 2018-02-01 LAB — DRUGS OF ABUSE SCREEN W/O ALC, ROUTINE URINE
AMPHETAMINES (1000 NG/ML SCRN): NEGATIVE
BARBITURATES: NEGATIVE
BENZODIAZEPINES: NEGATIVE
COCAINE METABOLITES: NEGATIVE
HYDROCODONE: POSITIVE — AB
HYDROMORPHONE: POSITIVE — AB
MARIJUANA MET (50 NG/ML SCRN): NEGATIVE
METHADONE: NEGATIVE
METHAQUALONE: NEGATIVE
OPIATES: POSITIVE — AB
PHENCYCLIDINE: NEGATIVE
PROPOXYPHENE: NEGATIVE

## 2018-02-01 LAB — LIPID PANEL
CHOL/HDL RATIO: 4.7 (calc) (ref <5.0)
CHOLESTEROL: 177 mg/dL (ref <200)
HDL: 38 mg/dL — AB (ref >40)
LDL Cholesterol (Calc): 116 mg/dL (calc) — ABNORMAL HIGH
Non-HDL Cholesterol (Calc): 139 mg/dL (calc) — ABNORMAL HIGH (ref <130)
TRIGLYCERIDES: 123 mg/dL (ref <150)

## 2018-02-27 ENCOUNTER — Other Ambulatory Visit: Payer: Self-pay | Admitting: Family Medicine

## 2018-02-27 NOTE — Telephone Encounter (Signed)
Pt needs refill on hydrocodone to [facility] [address]

## 2018-02-27 NOTE — Telephone Encounter (Signed)
Patient is requesting a refill on Hydrocodone   LOV: 1/10/200  LRF:   01/29/2018

## 2018-03-01 MED ORDER — HYDROCODONE-ACETAMINOPHEN 7.5-325 MG PO TABS: 1.0000 | ORAL_TABLET | Freq: Four times a day (QID) | ORAL | 0 refills | Status: DC | PRN

## 2018-03-05 ENCOUNTER — Encounter: Payer: Self-pay | Admitting: Family Medicine

## 2018-03-27 ENCOUNTER — Other Ambulatory Visit: Payer: Self-pay | Admitting: Family Medicine

## 2018-03-27 MED ORDER — HYDROCODONE-ACETAMINOPHEN 7.5-325 MG PO TABS: 1.0000 | ORAL_TABLET | Freq: Four times a day (QID) | ORAL | 0 refills | Status: DC | PRN

## 2018-03-27 NOTE — Telephone Encounter (Signed)
Patient is requesting a refill on Hydrocodone   LOV: 01/26/18  LRF:   03/01/18

## 2018-03-27 NOTE — Telephone Encounter (Signed)
[facility] [address]  Needs hydrocodone

## 2018-04-25 ENCOUNTER — Other Ambulatory Visit: Payer: Self-pay | Admitting: Family Medicine

## 2018-04-25 NOTE — Telephone Encounter (Signed)
[facility] [address]  Patient needs refill on hydrocodone [phone]

## 2018-04-26 MED ORDER — HYDROCODONE-ACETAMINOPHEN 7.5-325 MG PO TABS: 1.0000 | ORAL_TABLET | Freq: Four times a day (QID) | ORAL | 0 refills | Status: DC | PRN

## 2018-04-26 NOTE — Telephone Encounter (Signed)
Ok to refill??  Last office visit 01/26/2018.  Last refill 03/27/2018.

## 2018-05-24 ENCOUNTER — Other Ambulatory Visit: Payer: Self-pay | Admitting: Family Medicine

## 2018-05-24 MED ORDER — HYDROCODONE-ACETAMINOPHEN 7.5-325 MG PO TABS: 1.0000 | ORAL_TABLET | Freq: Four times a day (QID) | ORAL | 0 refills | Status: DC | PRN

## 2018-05-24 NOTE — Telephone Encounter (Signed)
Pt needs refill on hydrocodone to [facility] [address].   Also wants to know when it will be due for PA again.

## 2018-05-24 NOTE — Telephone Encounter (Signed)
Requesting refill    Hydrocodone  LOV: 01/26/18  LRF:  04/26/18

## 2018-06-21 ENCOUNTER — Telehealth: Payer: Self-pay | Admitting: Family Medicine

## 2018-06-21 MED ORDER — HYDROCODONE-ACETAMINOPHEN 7.5-325 MG PO TABS: 1.0000 | ORAL_TABLET | Freq: Four times a day (QID) | ORAL | 0 refills | Status: DC | PRN

## 2018-06-21 NOTE — Telephone Encounter (Signed)
This will require a PA but pharmacy has to run the RX first

## 2018-06-21 NOTE — Telephone Encounter (Signed)
Pt needs refill on pain med also may need new pa - [facility] [address]

## 2018-06-21 NOTE — Telephone Encounter (Signed)
Patient is requesting a refill on Hydrocodone   LOV: 01/26/18  LRF:   05/24/18

## 2018-06-25 ENCOUNTER — Encounter: Payer: Self-pay | Admitting: *Deleted

## 2018-06-25 NOTE — Telephone Encounter (Signed)
Received PA determination.   PA [id number] approved 06/25/2018 - 12/22/2018.

## 2018-06-25 NOTE — Telephone Encounter (Signed)
Received request from pharmacy for PA on Hydrocodone/APAP.   PA submitted. Confirmation # [id number] W.  Dx: M45.0- ankylosing spondylitis of spine

## 2018-07-06 ENCOUNTER — Telehealth: Payer: Self-pay | Admitting: Family Medicine

## 2018-07-06 NOTE — Telephone Encounter (Signed)
Wants to know if his PA for hydrocodone is still good?

## 2018-07-09 NOTE — Telephone Encounter (Signed)
06/25/2018 - 12/22/2018 

## 2018-07-10 NOTE — Telephone Encounter (Signed)
Pt aware via vm 

## 2018-07-18 ENCOUNTER — Other Ambulatory Visit: Payer: Self-pay | Admitting: Family Medicine

## 2018-07-18 MED ORDER — HYDROCODONE-ACETAMINOPHEN 7.5-325 MG PO TABS: 1.0000 | ORAL_TABLET | Freq: Four times a day (QID) | ORAL | 0 refills | Status: DC | PRN

## 2018-07-18 NOTE — Telephone Encounter (Signed)
Patient is requesting a refill on Hydrocodone   LOV: 01/26/18  LRF:   06/21/18

## 2018-08-17 ENCOUNTER — Other Ambulatory Visit: Payer: Self-pay | Admitting: Family Medicine

## 2018-08-17 MED ORDER — HYDROCODONE-ACETAMINOPHEN 7.5-325 MG PO TABS: 1.0000 | ORAL_TABLET | Freq: Four times a day (QID) | ORAL | 0 refills | Status: DC | PRN

## 2018-08-17 NOTE — Telephone Encounter (Signed)
CB# [phone] Patient would like his hydrocodone called in he uses [facility] on [address]

## 2018-08-17 NOTE — Telephone Encounter (Signed)
Patient is requesting a refill on Hydrocodone   LOV: 01/26/18  LRF:    07/18/18

## 2018-09-14 ENCOUNTER — Other Ambulatory Visit: Payer: Self-pay

## 2018-09-14 MED ORDER — HYDROCODONE-ACETAMINOPHEN 7.5-325 MG PO TABS: 1.0000 | ORAL_TABLET | Freq: Four times a day (QID) | ORAL | 0 refills | Status: DC | PRN

## 2018-09-14 NOTE — Telephone Encounter (Signed)
Requested Prescriptions   Pending Prescriptions Disp Refills  . HYDROcodone-acetaminophen (NORCO) 7.5-325 MG tablet 120 tablet 0    Sig: Take 1 tablet by mouth every 6 (six) hours as needed.    Last OV 01/26/2018  Last written 08/17/2018

## 2018-10-12 ENCOUNTER — Telehealth: Payer: Self-pay | Admitting: Family Medicine

## 2018-10-12 ENCOUNTER — Other Ambulatory Visit: Payer: Self-pay

## 2018-10-12 DIAGNOSIS — M450 Ankylosing spondylitis of multiple sites in spine: Secondary | ICD-10-CM

## 2018-10-12 MED ORDER — HYDROCODONE-ACETAMINOPHEN 7.5-325 MG PO TABS: 1.0000 | ORAL_TABLET | Freq: Four times a day (QID) | ORAL | 0 refills | Status: DC | PRN

## 2018-10-12 NOTE — Telephone Encounter (Signed)
Patient called in requesting a new ophthalmologist for ankylosing spondylitis. We last referred him last April but he is not happy with the provider. May I place a new referral.

## 2018-10-12 NOTE — Telephone Encounter (Signed)
Referral orders placed

## 2018-10-12 NOTE — Telephone Encounter (Signed)
sure

## 2018-10-12 NOTE — Telephone Encounter (Signed)
Requested Prescriptions   Pending Prescriptions Disp Refills  . HYDROcodone-acetaminophen (NORCO) 7.5-325 MG tablet 120 tablet 0    Sig: Take 1 tablet by mouth every 6 (six) hours as needed.    Last OV 01/26/2018  Last written 09/14/2018

## 2018-10-29 DIAGNOSIS — Z961 Presence of intraocular lens: Secondary | ICD-10-CM | POA: Diagnosis not present

## 2018-10-29 DIAGNOSIS — H2511 Age-related nuclear cataract, right eye: Secondary | ICD-10-CM | POA: Diagnosis not present

## 2018-10-29 DIAGNOSIS — H2013 Chronic iridocyclitis, bilateral: Secondary | ICD-10-CM | POA: Diagnosis not present

## 2018-10-29 DIAGNOSIS — H26492 Other secondary cataract, left eye: Secondary | ICD-10-CM | POA: Diagnosis not present

## 2018-11-12 ENCOUNTER — Other Ambulatory Visit: Payer: Self-pay | Admitting: Family Medicine

## 2018-11-12 MED ORDER — HYDROCODONE-ACETAMINOPHEN 7.5-325 MG PO TABS: 1.0000 | ORAL_TABLET | Freq: Four times a day (QID) | ORAL | 0 refills | Status: DC | PRN

## 2018-11-12 NOTE — Telephone Encounter (Signed)
Patient is requesting a refill on Hydrocodone   LOV: 01/26/18  LRF:   10/12/18  PA - 06/25/2018 - 12/22/2018

## 2018-11-12 NOTE — Telephone Encounter (Signed)
Patient is calling in requesting refill on his hydrocodone called into [facility] [address]. He would like for you to check the Prior authorization he believes it is coming up for this medication.  CB# [phone]

## 2018-11-13 ENCOUNTER — Encounter (INDEPENDENT_AMBULATORY_CARE_PROVIDER_SITE_OTHER): Payer: Medicaid Other | Admitting: Ophthalmology

## 2018-11-26 ENCOUNTER — Encounter (INDEPENDENT_AMBULATORY_CARE_PROVIDER_SITE_OTHER): Payer: Medicaid Other | Admitting: Ophthalmology

## 2018-11-26 DIAGNOSIS — H538 Other visual disturbances: Secondary | ICD-10-CM

## 2018-11-26 DIAGNOSIS — H59029 Cataract (lens) fragments in eye following cataract surgery, unspecified eye: Secondary | ICD-10-CM

## 2018-11-26 DIAGNOSIS — H43813 Vitreous degeneration, bilateral: Secondary | ICD-10-CM | POA: Diagnosis not present

## 2018-11-26 DIAGNOSIS — H2511 Age-related nuclear cataract, right eye: Secondary | ICD-10-CM

## 2018-12-12 ENCOUNTER — Other Ambulatory Visit: Payer: Self-pay

## 2018-12-12 MED ORDER — HYDROCODONE-ACETAMINOPHEN 7.5-325 MG PO TABS: 1.0000 | ORAL_TABLET | Freq: Four times a day (QID) | ORAL | 0 refills | Status: DC | PRN

## 2018-12-12 NOTE — Telephone Encounter (Signed)
Requested Prescriptions   Pending Prescriptions Disp Refills  . HYDROcodone-acetaminophen (NORCO) 7.5-325 MG tablet 120 tablet 0    Sig: Take 1 tablet by mouth every 6 (six) hours as needed.    Last OV 01/26/2018   Last written 11/12/2018

## 2018-12-21 ENCOUNTER — Telehealth: Payer: Self-pay | Admitting: Family Medicine

## 2018-12-24 NOTE — Telephone Encounter (Signed)
Will need PA for Hydrocodone - due for refill on 12/12/18

## 2019-01-08 NOTE — Telephone Encounter (Signed)
Received PA determination.   PA [id number] approved through 07/07/2019.

## 2019-01-08 NOTE — Telephone Encounter (Addendum)
Ok to refill??  Last office visit 01/26/2018.  Last refill 12/12/2018.  Of note, letter sent to patient to schedule appointment.

## 2019-01-08 NOTE — Telephone Encounter (Signed)
PA submitted via [employer].   Confirmation number: [id number] W.  Dx: M45.0- Ankylosing Spondylitis of spine

## 2019-01-08 NOTE — Addendum Note (Signed)
Addended by: Sheral Flow on: 01/08/2019 09:22 AM   Modules accepted: Orders

## 2019-01-09 ENCOUNTER — Other Ambulatory Visit: Payer: Self-pay | Admitting: Family Medicine

## 2019-01-09 MED ORDER — HYDROCODONE-ACETAMINOPHEN 7.5-325 MG PO TABS: 1.0000 | ORAL_TABLET | Freq: Four times a day (QID) | ORAL | 0 refills | Status: DC | PRN

## 2019-01-28 ENCOUNTER — Ambulatory Visit: Payer: Medicaid Other | Admitting: Family Medicine

## 2019-01-28 ENCOUNTER — Encounter: Payer: Self-pay | Admitting: Family Medicine

## 2019-01-28 ENCOUNTER — Other Ambulatory Visit: Payer: Self-pay

## 2019-01-28 VITALS — BP 134/68 | HR 62 | Temp 97.1°F | Resp 16 | Ht 73.0 in | Wt 268.0 lb

## 2019-01-28 DIAGNOSIS — Z1211 Encounter for screening for malignant neoplasm of colon: Secondary | ICD-10-CM

## 2019-01-28 DIAGNOSIS — Z13220 Encounter for screening for lipoid disorders: Secondary | ICD-10-CM

## 2019-01-28 DIAGNOSIS — Z125 Encounter for screening for malignant neoplasm of prostate: Secondary | ICD-10-CM | POA: Diagnosis not present

## 2019-01-28 DIAGNOSIS — M450 Ankylosing spondylitis of multiple sites in spine: Secondary | ICD-10-CM

## 2019-01-28 NOTE — Progress Notes (Signed)
Subjective:    Patient ID: Joshua Kramer, male    DOB: Sep 12, 1955, 64 y.o.   MRN: [id number]  HPI He is on chronic opiates for his chronic low back pain.  Patient has a history of ankylosing spondylitis. He has tried numerous biologic agents to prevent exacerbations of this under the care of a rheumatologist. He failed many of them either due to cost or secondary infections. He was unable to tolerate enbrel or humira.  He tried sulfasalazine in the past without any benefit. He is currently using Norco 7.5/325 one 4 times a day.  I referred the patient for a colonoscopy last year however he canceled this due to the Covid pandemic and has not rescheduled it.  Therefore he is long overdue for a colonoscopy.  He is also due for prostate cancer screening.  He discontinued his Lipitor when his prescription ran out.  He states that he is been off the medication for some time.  He is overdue for a pneumonia vaccine as well as a flu shot.  Offered the patient flu shot and pneumonia vaccine today and he declines this.  I tried to explain the rationale behind vaccines but the patient refuses vaccines today.  He does not want me to schedule him for a colonoscopy due to the Covid pandemic.  He states that he will call me back when he is ready for me to schedule this.  He does consent to fasting lab work.  He denies any chest pain or shortness of breath or dyspnea on exertion. Past Medical History:  Diagnosis Date  . Ankylosing spondylitis of multiple sites in spine ([place])   . Chest pain 05/2015   a. abnormal nuc stress test with normal cors and LV function on subsequent cath  . History of tobacco abuse    Past Surgical History:  Procedure Laterality Date  . CARDIAC CATHETERIZATION  2011   nl cors, nl EF  . CARDIAC CATHETERIZATION N/A 06/02/2015   Procedure: Left Heart Cath and Coronary Angiography;  Surgeon: Troy Sine, MD;  Location: [facility];  Service: Cardiovascular;  Laterality: N/A;    Current Outpatient Medications on File Prior to Visit  Medication Sig Dispense Refill  . atorvastatin (LIPITOR) 40 MG tablet Take 1 tablet (40 mg total) by mouth daily. 90 tablet 3  . gabapentin (NEURONTIN) 300 MG capsule Take 1 capsule (300 mg total) by mouth 3 (three) times daily. 30 capsule 0  . HYDROcodone-acetaminophen (NORCO) 7.5-325 MG tablet Take 1 tablet by mouth every 6 (six) hours as needed. 120 tablet 0  . Thiamine HCl (VITAMIN B-1) 250 MG tablet Take 250 mg by mouth daily.    . valACYclovir (VALTREX) 1000 MG tablet Take 1 tablet (1,000 mg total) by mouth 3 (three) times daily. 21 tablet 0  . vitamin E (VITAMIN E) 400 UNIT capsule Take 400 Units by mouth daily.     No current facility-administered medications on file prior to visit.   No Known Allergies Social History   Socioeconomic History  . Marital status: Married    Spouse name: Not on file  . Number of children: 1  . Years of education: Not on file  . Highest education level: Not on file  Occupational History  . Occupation: Sales  Tobacco Use  . Smoking status: Former Smoker    Quit date: 06/18/2014    Years since quitting: 4.6  . Smokeless tobacco: Never Used  Substance and Sexual Activity  . Alcohol use: No  Alcohol/week: 0.0 standard drinks  . Drug use: No  . Sexual activity: Yes  Other Topics Concern  . Not on file  Social History Narrative  . Not on file   Social Determinants of Health   Financial Resource Strain:   . Difficulty of Paying Living Expenses: Not on file  Food Insecurity:   . Worried About Programme researcher, broadcasting/film/video in the Last Year: Not on file  . Ran Out of Food in the Last Year: Not on file  Transportation Needs:   . Lack of Transportation (Medical): Not on file  . Lack of Transportation (Non-Medical): Not on file  Physical Activity:   . Days of Exercise per Week: Not on file  . Minutes of Exercise per Session: Not on file  Stress:   . Feeling of Stress : Not on file  Social  Connections:   . Frequency of Communication with Friends and Family: Not on file  . Frequency of Social Gatherings with Friends and Family: Not on file  . Attends Religious Services: Not on file  . Active Member of Clubs or Organizations: Not on file  . Attends Banker Meetings: Not on file  . Marital Status: Not on file  Intimate Partner Violence:   . Fear of Current or Ex-Partner: Not on file  . Emotionally Abused: Not on file  . Physically Abused: Not on file  . Sexually Abused: Not on file      Review of Systems  All other systems reviewed and are negative.      Objective:   Physical Exam Vitals reviewed.  Constitutional:      General: He is not in acute distress.    Appearance: Normal appearance. He is not ill-appearing or toxic-appearing.  Cardiovascular:     Rate and Rhythm: Normal rate and regular rhythm.     Heart sounds: Normal heart sounds. No murmur.  Pulmonary:     Effort: Pulmonary effort is normal. No respiratory distress.     Breath sounds: Normal breath sounds. No stridor. No wheezing, rhonchi or rales.  Musculoskeletal:     Cervical back: Rigidity and tenderness present. Pain with movement present. Decreased range of motion.     Thoracic back: Tenderness present. Decreased range of motion.     Lumbar back: Tenderness present. Decreased range of motion.  Neurological:     Mental Status: He is alert.           Assessment & Plan:  Screening cholesterol level - Plan: CBC with Differential, COMPLETE METABOLIC PANEL WITH GFR, Lipid Panel  Prostate cancer screening - Plan: PSA  Ankylosing spondylitis of multiple sites in spine [facility])  Colon cancer screening  Check patient's fasting lipid panel today.  Goal LDL cholesterol is less than 100.  Likely would recommend resuming statin if elevated beyond this.  Screen for prostate cancer with PSA.  We will continue to refill the patient's pain medication as prescribed.  Strongly recommend  colonoscopy.  Patient declines this at the present time.  He states he will call me back when he is ready for me to schedule this.  Recommended a flu shot as well as Prevnar 13 but the patient declined this as well.

## 2019-01-29 ENCOUNTER — Other Ambulatory Visit: Payer: Self-pay

## 2019-01-29 LAB — CBC WITH DIFFERENTIAL/PLATELET
Absolute Monocytes: 571 cells/uL (ref 200–950)
Basophils Absolute: 42 cells/uL (ref 0–200)
Basophils Relative: 0.5 %
Eosinophils Absolute: 67 cells/uL (ref 15–500)
Eosinophils Relative: 0.8 %
HCT: 44.6 % (ref 38.5–50.0)
Hemoglobin: 15.2 g/dL (ref 13.2–17.1)
Lymphs Abs: 2243 cells/uL (ref 850–3900)
MCH: 30.3 pg (ref 27.0–33.0)
MCHC: 34.1 g/dL (ref 32.0–36.0)
MCV: 88.8 fL (ref 80.0–100.0)
MPV: 9.7 fL (ref 7.5–12.5)
Monocytes Relative: 6.8 %
Neutro Abs: 5477 cells/uL (ref 1500–7800)
Neutrophils Relative %: 65.2 %
Platelets: 275 10*3/uL (ref 140–400)
RBC: 5.02 10*6/uL (ref 4.20–5.80)
RDW: 12.8 % (ref 11.0–15.0)
Total Lymphocyte: 26.7 %
WBC: 8.4 10*3/uL (ref 3.8–10.8)

## 2019-01-29 LAB — COMPLETE METABOLIC PANEL WITH GFR
AG Ratio: 1.6 (calc) (ref 1.0–2.5)
ALT: 19 U/L (ref 9–46)
AST: 15 U/L (ref 10–35)
Albumin: 4.2 g/dL (ref 3.6–5.1)
Alkaline phosphatase (APISO): 74 U/L (ref 35–144)
BUN: 17 mg/dL (ref 7–25)
CO2: 27 mmol/L (ref 20–32)
Calcium: 9.5 mg/dL (ref 8.6–10.3)
Chloride: 103 mmol/L (ref 98–110)
Creat: 0.84 mg/dL (ref 0.70–1.25)
GFR, Est African American: 108 mL/min/{1.73_m2} (ref >=60)
GFR, Est Non African American: 93 mL/min/{1.73_m2} (ref >=60)
Globulin: 2.6 g/dL (calc) (ref 1.9–3.7)
Glucose, Bld: 109 mg/dL — ABNORMAL HIGH (ref 65–99)
Potassium: 4.9 mmol/L (ref 3.5–5.3)
Sodium: 139 mmol/L (ref 135–146)
Total Bilirubin: 0.8 mg/dL (ref 0.2–1.2)
Total Protein: 6.8 g/dL (ref 6.1–8.1)

## 2019-01-29 LAB — LIPID PANEL
Cholesterol: 189 mg/dL (ref <200)
HDL: 38 mg/dL — ABNORMAL LOW (ref >=40)
LDL Cholesterol (Calc): 124 mg/dL (calc) — ABNORMAL HIGH
Non-HDL Cholesterol (Calc): 151 mg/dL (calc) — ABNORMAL HIGH (ref <130)
Total CHOL/HDL Ratio: 5 (calc) — ABNORMAL HIGH (ref <5.0)
Triglycerides: 157 mg/dL — ABNORMAL HIGH (ref <150)

## 2019-01-29 LAB — PSA: PSA: 0.3 ng/mL (ref <=4.0)

## 2019-01-29 MED ORDER — ATORVASTATIN CALCIUM 20 MG PO TABS: 20.0000 mg | ORAL_TABLET | Freq: Every day | ORAL | 3 refills | Status: DC

## 2019-02-11 ENCOUNTER — Other Ambulatory Visit: Payer: Self-pay | Admitting: Family Medicine

## 2019-02-11 NOTE — Telephone Encounter (Signed)
Patient requesting refill on his hydrocodone he uses [facility] on [address]  CB# [phone]

## 2019-02-11 NOTE — Telephone Encounter (Signed)
Patient is requesting a refill on Hydrocodone   LOV: 01/28/2019  LRF:   01/09/2020

## 2019-02-12 MED ORDER — HYDROCODONE-ACETAMINOPHEN 7.5-325 MG PO TABS: 1.0000 | ORAL_TABLET | Freq: Four times a day (QID) | ORAL | 0 refills | Status: DC | PRN

## 2019-03-14 ENCOUNTER — Other Ambulatory Visit: Payer: Self-pay | Admitting: Family Medicine

## 2019-03-14 MED ORDER — HYDROCODONE-ACETAMINOPHEN 7.5-325 MG PO TABS: 1.0000 | ORAL_TABLET | Freq: Four times a day (QID) | ORAL | 0 refills | Status: DC | PRN

## 2019-03-14 NOTE — Telephone Encounter (Signed)
Patient is requesting a refill on Hydrocodone   LOV:  01/28/19  LRF:   02/12/19

## 2019-03-14 NOTE — Telephone Encounter (Signed)
Patient needs refill on hydrocodone [facility] [address]

## 2019-04-10 ENCOUNTER — Telehealth: Payer: Self-pay | Admitting: Family Medicine

## 2019-04-10 NOTE — Telephone Encounter (Signed)
Patient needs refill on his hydrocodone [facility] [employer]

## 2019-04-11 MED ORDER — HYDROCODONE-ACETAMINOPHEN 7.5-325 MG PO TABS: 1.0000 | ORAL_TABLET | Freq: Four times a day (QID) | ORAL | 0 refills | Status: DC | PRN

## 2019-04-11 NOTE — Telephone Encounter (Signed)
Patient is requesting a refill on Hydrocodone   LOV: 01/28/2019  LRF:   03/14/2019

## 2019-04-29 ENCOUNTER — Other Ambulatory Visit: Payer: Medicaid Other

## 2019-05-02 ENCOUNTER — Ambulatory Visit: Payer: Medicaid Other | Admitting: Family Medicine

## 2019-05-02 ENCOUNTER — Other Ambulatory Visit: Payer: Self-pay

## 2019-05-03 ENCOUNTER — Encounter: Payer: Self-pay | Admitting: Family Medicine

## 2019-05-03 ENCOUNTER — Ambulatory Visit (INDEPENDENT_AMBULATORY_CARE_PROVIDER_SITE_OTHER): Payer: Medicaid Other | Admitting: Family Medicine

## 2019-05-03 VITALS — BP 142/80 | HR 58 | Temp 96.6°F | Resp 16 | Ht 73.0 in | Wt 250.0 lb

## 2019-05-03 DIAGNOSIS — E7800 Pure hypercholesterolemia, unspecified: Secondary | ICD-10-CM | POA: Diagnosis not present

## 2019-05-03 DIAGNOSIS — M5412 Radiculopathy, cervical region: Secondary | ICD-10-CM | POA: Diagnosis not present

## 2019-05-03 MED ORDER — PREDNISONE 20 MG PO TABS: ORAL_TABLET | ORAL | 0 refills | Status: DC

## 2019-05-03 NOTE — Progress Notes (Signed)
Subjective:    Patient ID: Joshua Kramer, male    DOB: 31-Jul-1955, 64 y.o.   MRN: [id number]  Medication Refill   He is on chronic opiates for his chronic low back pain.  Patient has a history of ankylosing spondylitis. He has tried numerous biologic agents to prevent exacerbations of this under the care of a rheumatologist. He failed many of them either due to cost or secondary infections. He was unable to tolerate enbrel or humira.  He tried sulfasalazine in the past without any benefit. He is currently using Norco 7.5/325 one 4 times a day. Patient presents today complaining of neuropathic pain radiating down his right arm.  Majority of the time the pain starts around the right elbow over the medial epicondyle.  It will then radiate down the medial aspect of his right arm into the fourth and fifth digits.  It is a burning stinging pain.  However sometimes it will radiate up the arm all the way to his neck.  Differential diagnosis includes cervical radiculopathy versus ulnar radiculopathy versus possibly carpal tunnel.  Patient is unable to pinpoint exactly which fingers are involved.  However he has a negative Tinel's sign today.  He has negative Phalen sign.  He has normal range of motion of the wrist.  He has no pain with flexion or extension of the elbow and no tenderness to palpation around the medial epicondyle or ulnar groove.  He has significant pain with range of motion in his cervical spine but this is chronic. Past Medical History:  Diagnosis Date  . Ankylosing spondylitis of multiple sites in spine ([facility])   . Chest pain 05/2015   a. abnormal nuc stress test with normal cors and LV function on subsequent cath  . History of tobacco abuse    Past Surgical History:  Procedure Laterality Date  . CARDIAC CATHETERIZATION  2011   nl cors, nl EF  . CARDIAC CATHETERIZATION N/A 06/02/2015   Procedure: Left Heart Cath and Coronary Angiography;  Surgeon: Lennette Bihari, MD;  Location: [facility]  [facility];  Service: Cardiovascular;  Laterality: N/A;   Current Outpatient Medications on File Prior to Visit  Medication Sig Dispense Refill  . atorvastatin (LIPITOR) 20 MG tablet Take 1 tablet (20 mg total) by mouth daily. 30 tablet 3  . gabapentin (NEURONTIN) 300 MG capsule Take 1 capsule (300 mg total) by mouth 3 (three) times daily. 30 capsule 0  . HYDROcodone-acetaminophen (NORCO) 7.5-325 MG tablet Take 1 tablet by mouth every 6 (six) hours as needed. 120 tablet 0  . Thiamine HCl (VITAMIN B-1) 250 MG tablet Take 250 mg by mouth daily.    . valACYclovir (VALTREX) 1000 MG tablet Take 1 tablet (1,000 mg total) by mouth 3 (three) times daily. 21 tablet 0  . vitamin E (VITAMIN E) 400 UNIT capsule Take 400 Units by mouth daily.     No current facility-administered medications on file prior to visit.   No Known Allergies Social History   Socioeconomic History  . Marital status: Married    Spouse name: Not on file  . Number of children: 1  . Years of education: Not on file  . Highest education level: Not on file  Occupational History  . Occupation: Sales  Tobacco Use  . Smoking status: Former Smoker    Quit date: 06/18/2014    Years since quitting: 4.8  . Smokeless tobacco: Never Used  Substance and Sexual Activity  . Alcohol use: No  Alcohol/week: 0.0 standard drinks  . Drug use: No  . Sexual activity: Yes  Other Topics Concern  . Not on file  Social History Narrative  . Not on file   Social Determinants of Health   Financial Resource Strain:   . Difficulty of Paying Living Expenses:   Food Insecurity:   . Worried About Charity fundraiser in the Last Year:   . Arboriculturist in the Last Year:   Transportation Needs:   . Film/video editor (Medical):   Marland Kitchen Lack of Transportation (Non-Medical):   Physical Activity:   . Days of Exercise per Week:   . Minutes of Exercise per Session:   Stress:   . Feeling of Stress :   Social Connections:   . Frequency of  Communication with Friends and Family:   . Frequency of Social Gatherings with Friends and Family:   . Attends Religious Services:   . Active Member of Clubs or Organizations:   . Attends Archivist Meetings:   Marland Kitchen Marital Status:   Intimate Partner Violence:   . Fear of Current or Ex-Partner:   . Emotionally Abused:   Marland Kitchen Physically Abused:   . Sexually Abused:       Review of Systems  All other systems reviewed and are negative.      Objective:   Physical Exam Vitals reviewed.  Constitutional:      General: He is not in acute distress.    Appearance: Normal appearance. He is not ill-appearing or toxic-appearing.  Cardiovascular:     Rate and Rhythm: Normal rate and regular rhythm.     Heart sounds: Normal heart sounds. No murmur.  Pulmonary:     Effort: Pulmonary effort is normal. No respiratory distress.     Breath sounds: Normal breath sounds. No stridor. No wheezing, rhonchi or rales.  Musculoskeletal:     Cervical back: Rigidity and tenderness present. Pain with movement present. Decreased range of motion.     Thoracic back: Tenderness present. Decreased range of motion.     Lumbar back: Tenderness present. Decreased range of motion.  Neurological:     Mental Status: He is alert.           Assessment & Plan:  Pure hypercholesterolemia - Plan: CBC with Differential/Platelet, COMPLETE METABOLIC PANEL WITH GFR, Lipid panel  Cervical radiculopathy  Differential diagnosis includes cervical radiculopathy versus ulnar neuropathy.  Start prednisone taper pack and reassess next week.  If symptoms continue to be inconclusive I would recommend nerve conduction studies to evaluate further.  I would lean more towards cervical radiculopathy given his ankylosing spondylitis.  Patient has lost considerable weight since I last saw him and we will recheck a CMP and fasting lipid panel today to monitor his elevated blood sugar and hyperlipidemia.

## 2019-05-04 LAB — COMPLETE METABOLIC PANEL WITH GFR
AG Ratio: 1.7 (calc) (ref 1.0–2.5)
ALT: 18 U/L (ref 9–46)
AST: 13 U/L (ref 10–35)
Albumin: 4.4 g/dL (ref 3.6–5.1)
Alkaline phosphatase (APISO): 75 U/L (ref 35–144)
BUN: 18 mg/dL (ref 7–25)
CO2: 29 mmol/L (ref 20–32)
Calcium: 9.5 mg/dL (ref 8.6–10.3)
Chloride: 105 mmol/L (ref 98–110)
Creat: 0.88 mg/dL (ref 0.70–1.25)
GFR, Est African American: 106 mL/min/{1.73_m2} (ref >=60)
GFR, Est Non African American: 91 mL/min/{1.73_m2} (ref >=60)
Globulin: 2.6 g/dL (calc) (ref 1.9–3.7)
Glucose, Bld: 116 mg/dL — ABNORMAL HIGH (ref 65–99)
Potassium: 4.4 mmol/L (ref 3.5–5.3)
Sodium: 140 mmol/L (ref 135–146)
Total Bilirubin: 0.6 mg/dL (ref 0.2–1.2)
Total Protein: 7 g/dL (ref 6.1–8.1)

## 2019-05-04 LAB — CBC WITH DIFFERENTIAL/PLATELET
Absolute Monocytes: 549 cells/uL (ref 200–950)
Basophils Absolute: 49 cells/uL (ref 0–200)
Basophils Relative: 0.6 %
Eosinophils Absolute: 98 cells/uL (ref 15–500)
Eosinophils Relative: 1.2 %
HCT: 47.1 % (ref 38.5–50.0)
Hemoglobin: 15.9 g/dL (ref 13.2–17.1)
Lymphs Abs: 2181 cells/uL (ref 850–3900)
MCH: 30.5 pg (ref 27.0–33.0)
MCHC: 33.8 g/dL (ref 32.0–36.0)
MCV: 90.2 fL (ref 80.0–100.0)
MPV: 9.8 fL (ref 7.5–12.5)
Monocytes Relative: 6.7 %
Neutro Abs: 5322 cells/uL (ref 1500–7800)
Neutrophils Relative %: 64.9 %
Platelets: 244 10*3/uL (ref 140–400)
RBC: 5.22 10*6/uL (ref 4.20–5.80)
RDW: 13 % (ref 11.0–15.0)
Total Lymphocyte: 26.6 %
WBC: 8.2 10*3/uL (ref 3.8–10.8)

## 2019-05-04 LAB — LIPID PANEL
Cholesterol: 198 mg/dL (ref <200)
HDL: 38 mg/dL — ABNORMAL LOW (ref >=40)
LDL Cholesterol (Calc): 134 mg/dL (calc) — ABNORMAL HIGH
Non-HDL Cholesterol (Calc): 160 mg/dL (calc) — ABNORMAL HIGH (ref <130)
Total CHOL/HDL Ratio: 5.2 (calc) — ABNORMAL HIGH (ref <5.0)
Triglycerides: 135 mg/dL (ref <150)

## 2019-05-08 ENCOUNTER — Telehealth: Payer: Self-pay | Admitting: Family Medicine

## 2019-05-08 ENCOUNTER — Other Ambulatory Visit: Payer: Self-pay | Admitting: Family Medicine

## 2019-05-08 MED ORDER — ATORVASTATIN CALCIUM 40 MG PO TABS: 40.0000 mg | ORAL_TABLET | Freq: Every day | ORAL | 3 refills | Status: DC

## 2019-05-08 NOTE — Telephone Encounter (Signed)
Spoke to pt and he states that it is his whole hand that hurts (he said you thought it was something and it is not what you thought it was). He also states that he is having back spasms and can not sleep and wanted to know if you could prescribe him something for that.?

## 2019-05-09 NOTE — Telephone Encounter (Signed)
If prednisone did not help, recommend we schedule him for nerve conduction studies to determine the cause.  Can use zanaflex 4 mg poq6 hours prn muscle spasms.

## 2019-05-10 ENCOUNTER — Other Ambulatory Visit: Payer: Self-pay | Admitting: Family Medicine

## 2019-05-10 MED ORDER — TIZANIDINE HCL 6 MG PO CAPS
6.0000 mg | ORAL_CAPSULE | Freq: Four times a day (QID) | ORAL | 0 refills | Status: DC
Start: 2019-05-10 — End: 2019-05-10

## 2019-05-10 MED ORDER — TIZANIDINE HCL 4 MG PO TABS
4.0000 mg | ORAL_TABLET | Freq: Four times a day (QID) | ORAL | 0 refills | Status: DC | PRN
Start: 2019-05-10 — End: 2019-05-24

## 2019-05-10 MED ORDER — HYDROCODONE-ACETAMINOPHEN 7.5-325 MG PO TABS: 1.0000 | ORAL_TABLET | Freq: Four times a day (QID) | ORAL | 0 refills | Status: DC | PRN

## 2019-05-10 NOTE — Telephone Encounter (Signed)
Patient is requesting a refill on Hydrocodone   LOV: 05/03/2019  LRF:   04/11/2019

## 2019-05-10 NOTE — Telephone Encounter (Signed)
CB#[phone] [facility]  [address] refill Hydrocodone

## 2019-05-10 NOTE — Telephone Encounter (Signed)
Pt aware and would like to try the muscle relaxer but hold off on the NCS for now. He will call back if muscle relaxer is no help. Med sent to pharm.

## 2019-05-24 ENCOUNTER — Other Ambulatory Visit: Payer: Self-pay | Admitting: Family Medicine

## 2019-05-24 MED ORDER — TIZANIDINE HCL 4 MG PO TABS: 4.0000 mg | ORAL_TABLET | Freq: Four times a day (QID) | ORAL | 0 refills | Status: DC | PRN

## 2019-05-24 NOTE — Telephone Encounter (Signed)
Patient needs refill on his tizanidine  [facility] [address]

## 2019-05-24 NOTE — Telephone Encounter (Signed)
Requesting refill    Tizanidine  LOV: 05/03/2019  LRF:  05/10/2019

## 2019-06-03 ENCOUNTER — Telehealth: Payer: Self-pay | Admitting: Family Medicine

## 2019-06-03 NOTE — Telephone Encounter (Signed)
CB# [phone] Pt need refill on tizanidine instead of  30 days supply can he get refill for  60-90 days medication working well

## 2019-06-04 MED ORDER — TIZANIDINE HCL 4 MG PO TABS: 4.0000 mg | ORAL_TABLET | Freq: Four times a day (QID) | ORAL | 0 refills | Status: DC | PRN

## 2019-06-04 NOTE — Telephone Encounter (Signed)
ok 

## 2019-06-04 NOTE — Telephone Encounter (Signed)
OK for 90day supply

## 2019-06-04 NOTE — Telephone Encounter (Signed)
Med sent for 90 day supply 

## 2019-06-07 ENCOUNTER — Other Ambulatory Visit: Payer: Self-pay | Admitting: Family Medicine

## 2019-06-07 MED ORDER — HYDROCODONE-ACETAMINOPHEN 7.5-325 MG PO TABS: 1.0000 | ORAL_TABLET | Freq: Four times a day (QID) | ORAL | 0 refills | Status: DC | PRN

## 2019-06-07 NOTE — Telephone Encounter (Signed)
CB # [phone] Refill Hydrocodone

## 2019-06-07 NOTE — Telephone Encounter (Signed)
Patient is requesting a refill on Hydrocodone   LOV: 05/03/2019  LRF:   05/10/2019

## 2019-06-21 ENCOUNTER — Other Ambulatory Visit: Payer: Self-pay | Admitting: Family Medicine

## 2019-06-21 NOTE — Telephone Encounter (Signed)
Ok to refill 

## 2019-07-05 ENCOUNTER — Other Ambulatory Visit: Payer: Self-pay | Admitting: Family Medicine

## 2019-07-05 MED ORDER — HYDROCODONE-ACETAMINOPHEN 7.5-325 MG PO TABS: 1.0000 | ORAL_TABLET | Freq: Four times a day (QID) | ORAL | 0 refills | Status: DC | PRN

## 2019-07-05 NOTE — Telephone Encounter (Signed)
Last refill 06/07/19 

## 2019-07-05 NOTE — Telephone Encounter (Signed)
Refill- Hydrocodone

## 2019-07-08 ENCOUNTER — Telehealth: Payer: Self-pay | Admitting: Family Medicine

## 2019-07-08 NOTE — Telephone Encounter (Signed)
Received request from pharmacy for PA on Hydrocodone.   PA submitted via [employer]. Confirmation # [id number] W.  Dx: M45.0- Ankylosing spondylitis

## 2019-07-08 NOTE — Telephone Encounter (Signed)
Patient states he needs a new prior authorization for his hydrocodone.  CB#[phone]

## 2019-07-09 NOTE — Telephone Encounter (Signed)
Pharmacy made aware of prescription quantity

## 2019-07-09 NOTE — Telephone Encounter (Signed)
Prior Approval #:[id number]   Effective Begin Date:07/08/2019 Effective End Date:01/04/2020

## 2019-07-15 ENCOUNTER — Other Ambulatory Visit: Payer: Self-pay | Admitting: Family Medicine

## 2019-07-16 NOTE — Telephone Encounter (Signed)
Ok to refill 

## 2019-08-05 ENCOUNTER — Other Ambulatory Visit: Payer: Self-pay | Admitting: Family Medicine

## 2019-08-05 ENCOUNTER — Telehealth: Payer: Self-pay | Admitting: Family Medicine

## 2019-08-05 ENCOUNTER — Other Ambulatory Visit: Payer: Self-pay

## 2019-08-05 MED ORDER — HYDROCODONE-ACETAMINOPHEN 7.5-325 MG PO TABS: 1.0000 | ORAL_TABLET | Freq: Four times a day (QID) | ORAL | 0 refills | Status: DC | PRN

## 2019-08-05 NOTE — Telephone Encounter (Signed)
CB # [phone] Pt need refill on Hydrocodone

## 2019-08-05 NOTE — Telephone Encounter (Signed)
Rx refill sent to provider 

## 2019-08-05 NOTE — Telephone Encounter (Signed)
Last refill  07/05/19 

## 2019-08-06 NOTE — Telephone Encounter (Signed)
Ok to refill 

## 2019-08-31 ENCOUNTER — Other Ambulatory Visit: Payer: Self-pay | Admitting: Family Medicine

## 2019-09-03 ENCOUNTER — Other Ambulatory Visit: Payer: Self-pay

## 2019-09-03 MED ORDER — HYDROCODONE-ACETAMINOPHEN 7.5-325 MG PO TABS: 1.0000 | ORAL_TABLET | Freq: Four times a day (QID) | ORAL | 0 refills | Status: DC | PRN

## 2019-09-03 NOTE — Telephone Encounter (Signed)
CB# [phone] Refill Hydrocodone , Tizanidine

## 2019-09-03 NOTE — Telephone Encounter (Signed)
Requested Prescriptions   Pending Prescriptions Disp Refills  . tiZANidine (ZANAFLEX) 4 MG tablet [Pharmacy Med Name: TIZANIDINE 4MG  TABLETS] 90 tablet 0    Sig: TAKE 1 TABLET(4 MG) BY MOUTH EVERY 6 HOURS AS NEEDED FOR MUSCLE SPASMS  . HYDROcodone-acetaminophen (NORCO) 7.5-325 MG tablet 120 tablet 0    Sig: Take 1 tablet by mouth every 6 (six) hours as needed.     Last OV 05/03/2019   Last written 08/05/2019

## 2019-09-24 ENCOUNTER — Other Ambulatory Visit: Payer: Self-pay | Admitting: Family Medicine

## 2019-09-24 NOTE — Telephone Encounter (Signed)
Ok to refill 

## 2019-10-03 ENCOUNTER — Other Ambulatory Visit: Payer: Self-pay

## 2019-10-03 MED ORDER — HYDROCODONE-ACETAMINOPHEN 7.5-325 MG PO TABS: 1.0000 | ORAL_TABLET | Freq: Four times a day (QID) | ORAL | 0 refills | Status: DC | PRN

## 2019-10-03 NOTE — Telephone Encounter (Signed)
Requested Prescriptions   Pending Prescriptions Disp Refills  . HYDROcodone-acetaminophen (NORCO) 7.5-325 MG tablet 120 tablet 0    Sig: Take 1 tablet by mouth every 6 (six) hours as needed.     Last OV 05/03/2019   Last written 09/03/2019

## 2019-10-16 ENCOUNTER — Other Ambulatory Visit: Payer: Self-pay | Admitting: Family Medicine

## 2019-10-16 NOTE — Telephone Encounter (Signed)
Ok to refill 

## 2019-10-30 ENCOUNTER — Other Ambulatory Visit: Payer: Self-pay

## 2019-10-30 NOTE — Telephone Encounter (Signed)
Pt need refill on HYDROcodone-acetaminophen (NORCO) 7.5-325 MG tablet [id number] - [place], [place] - [address] AT [facility] [address] [facility] [address] [facility] [address]   Pt call back [phone]

## 2019-10-30 NOTE — Telephone Encounter (Signed)
Ok to refill??  Last office visit 08/05/2019.  Last refill 10/03/2019.

## 2019-10-31 MED ORDER — HYDROCODONE-ACETAMINOPHEN 7.5-325 MG PO TABS: 1.0000 | ORAL_TABLET | Freq: Four times a day (QID) | ORAL | 0 refills | Status: DC | PRN

## 2019-11-17 ENCOUNTER — Other Ambulatory Visit: Payer: Self-pay | Admitting: Family Medicine

## 2019-11-18 NOTE — Telephone Encounter (Signed)
Ok to refill 

## 2019-11-28 ENCOUNTER — Other Ambulatory Visit: Payer: Self-pay | Admitting: Family Medicine

## 2019-11-28 ENCOUNTER — Telehealth: Payer: Self-pay | Admitting: Family Medicine

## 2019-11-28 MED ORDER — HYDROCODONE-ACETAMINOPHEN 7.5-325 MG PO TABS: 1.0000 | ORAL_TABLET | Freq: Four times a day (QID) | ORAL | 0 refills | Status: DC | PRN

## 2019-11-28 NOTE — Telephone Encounter (Signed)
Please Advise

## 2019-11-28 NOTE — Telephone Encounter (Signed)
Refill- Hydrocodone

## 2019-12-17 ENCOUNTER — Telehealth: Payer: Self-pay | Admitting: Family Medicine

## 2019-12-17 NOTE — Telephone Encounter (Signed)
error 

## 2019-12-17 NOTE — Telephone Encounter (Signed)
Error

## 2019-12-20 ENCOUNTER — Telehealth: Payer: Self-pay | Admitting: Family Medicine

## 2019-12-20 NOTE — Telephone Encounter (Signed)
Need to be excuse from jury duty. Forms was fax information place in folder for Dr.Pickard

## 2019-12-23 ENCOUNTER — Encounter: Payer: Self-pay | Admitting: Family Medicine

## 2019-12-24 NOTE — Telephone Encounter (Signed)
Forms placed on Providers desk.

## 2019-12-25 ENCOUNTER — Other Ambulatory Visit: Payer: Self-pay | Admitting: Family Medicine

## 2019-12-25 NOTE — Telephone Encounter (Signed)
Request completed.

## 2019-12-25 NOTE — Telephone Encounter (Signed)
Spoke with pt Joshua Kramer papers ready to be pick up pt would like for forms to be mailed. Forms mailed today 12/25/19

## 2019-12-25 NOTE — Telephone Encounter (Signed)
Ok to refill??  Last office visit 05/03/2019.  Last refill 11/28/2019.

## 2019-12-25 NOTE — Telephone Encounter (Signed)
Refill- Hydrocodone

## 2019-12-26 MED ORDER — HYDROCODONE-ACETAMINOPHEN 7.5-325 MG PO TABS: 1.0000 | ORAL_TABLET | Freq: Four times a day (QID) | ORAL | 0 refills | Status: DC | PRN

## 2020-01-14 ENCOUNTER — Other Ambulatory Visit: Payer: Self-pay | Admitting: Family Medicine

## 2020-01-15 NOTE — Telephone Encounter (Signed)
Ok to refill 

## 2020-01-23 ENCOUNTER — Telehealth: Payer: Self-pay

## 2020-01-23 ENCOUNTER — Other Ambulatory Visit: Payer: Self-pay | Admitting: Family Medicine

## 2020-01-23 MED ORDER — HYDROCODONE-ACETAMINOPHEN 7.5-325 MG PO TABS: 1.0000 | ORAL_TABLET | Freq: Four times a day (QID) | ORAL | 0 refills | Status: DC | PRN

## 2020-01-23 NOTE — Telephone Encounter (Signed)
Ok to refill??  Last office visit 05/03/2019.  Last refill 12/26/2019.

## 2020-01-23 NOTE — Telephone Encounter (Signed)
Needs PA for  HYDROcodone-acetaminophen (NORCO) 7.5-325 MG   Pharmacy:  [facility] [address]

## 2020-01-23 NOTE — Telephone Encounter (Signed)
Medication refilled on 01/23/2020.

## 2020-01-23 NOTE — Telephone Encounter (Signed)
Patient requesting a refill on his HYDROcodone-acetaminophen (NORCO) 7.5-325 MG tablet  Called into [facility] on [address].  CB# [phone]

## 2020-01-27 ENCOUNTER — Telehealth: Payer: Self-pay | Admitting: Family Medicine

## 2020-01-27 NOTE — Telephone Encounter (Signed)
Pt watiing for a prior auth for his refill on Hydrocodone

## 2020-01-27 NOTE — Telephone Encounter (Signed)
PA has been completed for patient

## 2020-01-28 ENCOUNTER — Other Ambulatory Visit: Payer: Self-pay

## 2020-01-28 MED ORDER — HYDROCODONE-ACETAMINOPHEN 7.5-325 MG PO TABS: 1.0000 | ORAL_TABLET | Freq: Four times a day (QID) | ORAL | 0 refills | Status: DC | PRN

## 2020-02-02 ENCOUNTER — Other Ambulatory Visit: Payer: Self-pay | Admitting: Family Medicine

## 2020-02-24 ENCOUNTER — Telehealth: Payer: Self-pay | Admitting: Family Medicine

## 2020-02-24 NOTE — Telephone Encounter (Signed)
Received request from pharmacy for PA on Hydrocodone/APAP.   PA submitted.   Dx: M45.0- Ankylosing spondylitis of spine.  Your information has been sent to [facility].

## 2020-02-24 NOTE — Telephone Encounter (Signed)
[employer] [facility] [employer] [place] [employer] has not yet replied to your PA request. You may close this dialog, return to your dashboard, and perform other tasks.  To check for an update later, open this request again from your dashboard.  If [facility] [facility] [place] [employer] has not replied to your request within 24 hours please contact [facility] [facility] [place] [employer] at [phone]. 

## 2020-02-24 NOTE — Telephone Encounter (Signed)
Received PA determination.   PA denied as plan requesting chart notes. Information submitted again.

## 2020-02-24 NOTE — Telephone Encounter (Signed)
Pt called needing to know about pre authorization for HYDROcodone-acetaminophen (NORCO) 7.5-325 MG tablet says he has been paying out of pocket for this med.

## 2020-02-24 NOTE — Telephone Encounter (Signed)
[facility] [place] [employer] has not yet replied to your PA request. You may close this dialog, return to your dashboard, and perform other tasks.  To check for an update later, open this request again from your dashboard.  If [facility] [facility] [place] [employer] has not replied to your request within 24 hours please contact [facility]  Medicaid at [phone]. 

## 2020-02-25 ENCOUNTER — Other Ambulatory Visit: Payer: Self-pay | Admitting: Family Medicine

## 2020-02-25 MED ORDER — HYDROCODONE-ACETAMINOPHEN 7.5-325 MG PO TABS: 1.0000 | ORAL_TABLET | Freq: Four times a day (QID) | ORAL | 0 refills | Status: DC | PRN

## 2020-02-25 NOTE — Telephone Encounter (Signed)
Received PA determination.   PA Case [id number] approved 02/25/2020- 08/23/2020.  Ok to refill??  Last office visit 05/03/2019. Of note, patient hs appointment scheduled on 02/28/2020.  Last refill 01/28/2020.

## 2020-02-28 ENCOUNTER — Other Ambulatory Visit: Payer: Self-pay | Admitting: Family Medicine

## 2020-02-28 ENCOUNTER — Other Ambulatory Visit: Payer: Self-pay

## 2020-02-28 ENCOUNTER — Ambulatory Visit (INDEPENDENT_AMBULATORY_CARE_PROVIDER_SITE_OTHER): Payer: Medicaid Other | Admitting: Family Medicine

## 2020-02-28 VITALS — BP 120/82 | HR 94 | Temp 97.0°F | Ht 73.0 in | Wt 260.0 lb

## 2020-02-28 DIAGNOSIS — M450 Ankylosing spondylitis of multiple sites in spine: Secondary | ICD-10-CM

## 2020-02-28 DIAGNOSIS — E7800 Pure hypercholesterolemia, unspecified: Secondary | ICD-10-CM

## 2020-02-28 DIAGNOSIS — Z13220 Encounter for screening for lipoid disorders: Secondary | ICD-10-CM

## 2020-02-28 DIAGNOSIS — Z0001 Encounter for general adult medical examination with abnormal findings: Secondary | ICD-10-CM | POA: Diagnosis not present

## 2020-02-28 DIAGNOSIS — Z125 Encounter for screening for malignant neoplasm of prostate: Secondary | ICD-10-CM | POA: Diagnosis not present

## 2020-02-28 NOTE — Telephone Encounter (Signed)
Ok to refill 

## 2020-02-28 NOTE — Progress Notes (Signed)
Subjective:    Patient ID: Joshua Kramer, male    DOB: Oct 14, 1955, 65 y.o.   MRN: [id number]  Medication Refill   He is on chronic opiates for his chronic low back pain.  Patient has a history of ankylosing spondylitis. He has tried numerous biologic agents to prevent exacerbations of this under the care of a rheumatologist. He failed many of them either due to cost or secondary infections. He was unable to tolerate enbrel or humira.  He tried sulfasalazine in the past without any benefit. He is currently using Norco 7.5/325 one 4 times a day.  Patient is overdue for a colonoscopy.  He is canceled this previously.  He declines a colonoscopy however after discussing the situation with him, he is willing to try Cologuard.  He is due for prostate cancer screening with PSA.  He refuses all vaccinations.  He refuses the pneumonia vaccine.  He refuses the COVID vaccine.  He refuses the flu shot and the shingles vaccine.  I spent more than 15 minutes today trying to persuade the patient to get the Covid shot but he is resistant to receiving due to his concerns about long-term risk of the vaccine.  He is yet to have Covid. Past Medical History:  Diagnosis Date  . Ankylosing spondylitis of multiple sites in spine (HCC)   . Chest pain 05/2015   a. abnormal nuc stress test with normal cors and LV function on subsequent cath  . History of tobacco abuse    Past Surgical History:  Procedure Laterality Date  . CARDIAC CATHETERIZATION  2011   nl cors, nl EF  . CARDIAC CATHETERIZATION N/A 06/02/2015   Procedure: Left Heart Cath and Coronary Angiography;  Surgeon: Lennette Bihari, MD;  Location: [facility] INVASIVE CV LAB;  Service: Cardiovascular;  Laterality: N/A;   Current Outpatient Medications on File Prior to Visit  Medication Sig Dispense Refill  . atorvastatin (LIPITOR) 40 MG tablet Take 1 tablet (40 mg total) by mouth daily. 90 tablet 3  . gabapentin (NEURONTIN) 300 MG capsule Take 1 capsule (300 mg total) by  mouth 3 (three) times daily. 30 capsule 0  . HYDROcodone-acetaminophen (NORCO) 7.5-325 MG tablet Take 1 tablet by mouth every 6 (six) hours as needed. 120 tablet 0  . predniSONE (DELTASONE) 20 MG tablet 3 tabs poqday 1-2, 2 tabs poqday 3-4, 1 tab poqday 5-6 12 tablet 0  . Thiamine HCl (VITAMIN B-1) 250 MG tablet Take 250 mg by mouth daily.    . valACYclovir (VALTREX) 1000 MG tablet Take 1 tablet (1,000 mg total) by mouth 3 (three) times daily. 21 tablet 0  . vitamin E (VITAMIN E) 400 UNIT capsule Take 400 Units by mouth daily.     No current facility-administered medications on file prior to visit.   No Known Allergies Social History   Socioeconomic History  . Marital status: Married    Spouse name: Not on file  . Number of children: 1  . Years of education: Not on file  . Highest education level: Not on file  Occupational History  . Occupation: Sales  Tobacco Use  . Smoking status: Former Smoker    Quit date: 06/18/2014    Years since quitting: 5.7  . Smokeless tobacco: Never Used  Substance and Sexual Activity  . Alcohol use: No    Alcohol/week: 0.0 standard drinks  . Drug use: No  . Sexual activity: Yes  Other Topics Concern  . Not on file  Social History Narrative  .  Not on file   Social Determinants of Health   Financial Resource Strain: Not on file  Food Insecurity: Not on file  Transportation Needs: Not on file  Physical Activity: Not on file  Stress: Not on file  Social Connections: Not on file  Intimate Partner Violence: Not on file      Review of Systems  All other systems reviewed and are negative.      Objective:   Physical Exam Vitals reviewed.  Constitutional:      General: He is not in acute distress.    Appearance: Normal appearance. He is not ill-appearing or toxic-appearing.  Cardiovascular:     Rate and Rhythm: Normal rate and regular rhythm.     Heart sounds: Normal heart sounds. No murmur heard.   Pulmonary:     Effort: Pulmonary  effort is normal. No respiratory distress.     Breath sounds: Normal breath sounds. No stridor. No wheezing, rhonchi or rales.  Musculoskeletal:     Cervical back: Rigidity and tenderness present. Pain with movement present. Decreased range of motion.     Thoracic back: Tenderness present. Decreased range of motion.     Lumbar back: Tenderness present. Decreased range of motion.  Neurological:     Mental Status: He is alert.           Assessment & Plan:  Screening cholesterol level  Ankylosing spondylitis of multiple sites in spine [facility])  Prostate cancer screening - Plan: PSA  Pure hypercholesterolemia - Plan: CBC with Differential/Platelet, COMPLETE METABOLIC PANEL WITH GFR, Lipid panel  Recommended a colonoscopy but he declines.  I will schedule the patient for Cologuard.  Recommended prostate cancer screening with PSA.  Blood pressures acceptable.  Check CBC, CMP, fasting lipid panel.  Strongly recommended the COVID vaccine.  I believe the patient will be at high risk for complications when he requires Covid however he declines.  Refuses a flu shot or the pneumonia vaccine.  I will be happy to give him any other vaccines if he changes his mind.  Continue to refill his Norco that he takes 4 times a day.  Patient states that he is in pain and the Norco no longer helps the pain.  I did offer a pain clinic referral as I do not feel comfortable with higher doses of narcotics however the patient declines this today

## 2020-02-29 LAB — COMPLETE METABOLIC PANEL WITH GFR
AG Ratio: 1.5 (calc) (ref 1.0–2.5)
ALT: 15 U/L (ref 9–46)
AST: 15 U/L (ref 10–35)
Albumin: 4.2 g/dL (ref 3.6–5.1)
Alkaline phosphatase (APISO): 79 U/L (ref 35–144)
BUN: 23 mg/dL (ref 7–25)
CO2: 25 mmol/L (ref 20–32)
Calcium: 9.2 mg/dL (ref 8.6–10.3)
Chloride: 106 mmol/L (ref 98–110)
Creat: 0.83 mg/dL (ref 0.70–1.25)
GFR, Est African American: 108 mL/min/{1.73_m2} (ref >=60)
GFR, Est Non African American: 93 mL/min/{1.73_m2} (ref >=60)
Globulin: 2.8 g/dL (calc) (ref 1.9–3.7)
Glucose, Bld: 97 mg/dL (ref 65–99)
Potassium: 4.6 mmol/L (ref 3.5–5.3)
Sodium: 141 mmol/L (ref 135–146)
Total Bilirubin: 0.9 mg/dL (ref 0.2–1.2)
Total Protein: 7 g/dL (ref 6.1–8.1)

## 2020-02-29 LAB — CBC WITH DIFFERENTIAL/PLATELET
Absolute Monocytes: 818 cells/uL (ref 200–950)
Basophils Absolute: 56 cells/uL (ref 0–200)
Basophils Relative: 0.6 %
Eosinophils Absolute: 132 cells/uL (ref 15–500)
Eosinophils Relative: 1.4 %
HCT: 47 % (ref 38.5–50.0)
Hemoglobin: 15.9 g/dL (ref 13.2–17.1)
Lymphs Abs: 2782 cells/uL (ref 850–3900)
MCH: 31.2 pg (ref 27.0–33.0)
MCHC: 33.8 g/dL (ref 32.0–36.0)
MCV: 92.2 fL (ref 80.0–100.0)
MPV: 10.1 fL (ref 7.5–12.5)
Monocytes Relative: 8.7 %
Neutro Abs: 5612 cells/uL (ref 1500–7800)
Neutrophils Relative %: 59.7 %
Platelets: 285 10*3/uL (ref 140–400)
RBC: 5.1 10*6/uL (ref 4.20–5.80)
RDW: 13.5 % (ref 11.0–15.0)
Total Lymphocyte: 29.6 %
WBC: 9.4 10*3/uL (ref 3.8–10.8)

## 2020-02-29 LAB — LIPID PANEL
Cholesterol: 122 mg/dL (ref <200)
HDL: 33 mg/dL — ABNORMAL LOW (ref >=40)
LDL Cholesterol (Calc): 67 mg/dL (calc)
Non-HDL Cholesterol (Calc): 89 mg/dL (calc) (ref <130)
Total CHOL/HDL Ratio: 3.7 (calc) (ref <5.0)
Triglycerides: 136 mg/dL (ref <150)

## 2020-02-29 LAB — PSA: PSA: 0.29 ng/mL (ref <=4.0)

## 2020-03-04 ENCOUNTER — Telehealth: Payer: Self-pay | Admitting: *Deleted

## 2020-03-04 NOTE — Telephone Encounter (Signed)
-----   Message from Donita Brooks, MD sent at 02/28/2020  3:20 PM EST ----- Please cologuard

## 2020-03-04 NOTE — Telephone Encounter (Signed)
Received verbal orders for Cologuard.   Order placed via [employer].   Cologuard (Order [id number])

## 2020-03-23 ENCOUNTER — Other Ambulatory Visit: Payer: Self-pay | Admitting: Family Medicine

## 2020-03-23 MED ORDER — HYDROCODONE-ACETAMINOPHEN 7.5-325 MG PO TABS: 1.0000 | ORAL_TABLET | Freq: Four times a day (QID) | ORAL | 0 refills | Status: DC | PRN

## 2020-03-23 NOTE — Telephone Encounter (Signed)
Patient requesting a refill on his hydrocodone he uses [facility] on [address].  CB# [phone]

## 2020-04-21 ENCOUNTER — Other Ambulatory Visit: Payer: Self-pay | Admitting: Family Medicine

## 2020-04-21 NOTE — Telephone Encounter (Signed)
Ok to refill??  Last office visit 02/28/2020.  Last refill 03/23/2020.

## 2020-04-21 NOTE — Telephone Encounter (Signed)
Patient called to request monthly refill of  HYDROcodone-acetaminophen (NORCO) 7.5-325 MG tablet [[id number]]   Pharmacy:   [facility] #[id number] [place], [place] - [address] AT [facility] OF [facility] [address] & [facility]  [address] [place] [address]  Phone:  [phone] Fax:  [phone]  DEA #:  [id number]   Please advise patient when Rx has been called in at [phone].

## 2020-04-23 MED ORDER — HYDROCODONE-ACETAMINOPHEN 7.5-325 MG PO TABS: 1.0000 | ORAL_TABLET | Freq: Four times a day (QID) | ORAL | 0 refills | Status: DC | PRN

## 2020-05-20 ENCOUNTER — Other Ambulatory Visit: Payer: Self-pay | Admitting: Family Medicine

## 2020-05-20 NOTE — Telephone Encounter (Signed)
Patient called to request refill of HYDROcodone-acetaminophen (NORCO) 7.5-325 MG tablet [[id number]]  Pharmacy:  [facility] #[id number] [place], [place] - [address] AT [facility] OF [facility] [address] & [facility]  [address] [place] [address]  Phone:  [phone] Fax:  [phone]  DEA #:  [id number]  Please advise aptient at [phone] when Rx called in.

## 2020-05-20 NOTE — Telephone Encounter (Signed)
Ok to refill??  Last office visit 02/28/2020.  Last refill 04/23/2020.

## 2020-05-21 ENCOUNTER — Telehealth: Payer: Self-pay | Admitting: Family Medicine

## 2020-05-21 MED ORDER — HYDROCODONE-ACETAMINOPHEN 7.5-325 MG PO TABS: 1.0000 | ORAL_TABLET | Freq: Four times a day (QID) | ORAL | 0 refills | Status: DC | PRN

## 2020-05-21 NOTE — Telephone Encounter (Signed)
Patient called to follow up on refill request for HYDROcodone-acetaminophen (NORCO) 7.5-325 MG tablet [[id number]]   Pharmacy told patient prior auth expired but it's should still be active.  Pharmacy confirmed as   [facility]ORE #[id number] [place], [place] - [address] AT [facility] OF [facility] [address] & [facility]  [address], [place] [address]  Phone:  [phone] Fax:  [phone]  DEA #:  [id number]  Please advise patient at [phone].

## 2020-05-21 NOTE — Telephone Encounter (Signed)
Call placed to pharmacy to inquire.   Pharmacy reports that patient filled Hydrocodone on 04/24/2020. Earliest fill is 05/22/2020.  Call placed to patient. LMTRC.

## 2020-05-22 NOTE — Telephone Encounter (Signed)
Multiple calls placed to patient with no answer and no return call.   Message to be closed.  

## 2020-06-22 ENCOUNTER — Other Ambulatory Visit: Payer: Self-pay | Admitting: Family Medicine

## 2020-06-22 MED ORDER — HYDROCODONE-ACETAMINOPHEN 7.5-325 MG PO TABS: 1.0000 | ORAL_TABLET | Freq: Four times a day (QID) | ORAL | 0 refills | Status: DC | PRN

## 2020-06-22 NOTE — Telephone Encounter (Signed)
Ok to refill 

## 2020-06-22 NOTE — Telephone Encounter (Signed)
Ok to refill??  Last office visit 02/28/2020.  Last refill 05/21/2020.

## 2020-06-22 NOTE — Telephone Encounter (Signed)
Patient called to request refill of HYDROcodone-acetaminophen (NORCO) 7.5-325 MG tablet [[id number]]   Pharmacy confirmed as   [facility] #[id number] [place], [place] - [address] AT [facility] OF [facility] [address] & [facility]  [address] [place] [address]  Phone:  [phone] Fax:  [phone]  DEA #:  [id number]  Patient requesting call back when Rx called in to pharmacy. Please advise at [phone].

## 2020-06-22 NOTE — Telephone Encounter (Signed)
PDMP reviewed and appears appropriate as patient takes four times daily.  Refill given in place of PCP who is out of office.

## 2020-06-23 ENCOUNTER — Telehealth: Payer: Self-pay | Admitting: Family Medicine

## 2020-06-23 NOTE — Telephone Encounter (Signed)
Received PA determination.   PA [id number] approved 06/17/2020- 06/23/2021.

## 2020-06-23 NOTE — Telephone Encounter (Signed)
Patient called to follow up on refill request for HYDROcodone-acetaminophen (NORCO) 7.5-325 MG tablet [[id number]]  Pharmacy informed patient Rx can't be refilled due to prior auth being expired. Patient called to verify and request new one if needed. Please advise at [phone].

## 2020-06-23 NOTE — Telephone Encounter (Signed)
Received request from pharmacy for PA on Hydrocodone/APAP.  PA submitted.   Dx: M45.0- ankylosing spondylitis of multiple sites of the spine.   Your information has been sent to [employer].

## 2020-07-13 ENCOUNTER — Other Ambulatory Visit: Payer: Self-pay | Admitting: *Deleted

## 2020-07-13 NOTE — Telephone Encounter (Signed)
Received fax requesting refill on Zanaflex.   Ok to refill? 

## 2020-07-21 ENCOUNTER — Other Ambulatory Visit: Payer: Self-pay | Admitting: Family Medicine

## 2020-07-21 MED ORDER — HYDROCODONE-ACETAMINOPHEN 7.5-325 MG PO TABS: 1.0000 | ORAL_TABLET | Freq: Four times a day (QID) | ORAL | 0 refills | Status: DC | PRN

## 2020-07-21 NOTE — Telephone Encounter (Signed)
Pt called in requesting a refill of  tiZANidine (ZANAFLEX) 4 MG tablet HYDROcodone-acetaminophen (NORCO) 7.5-325 MG tablet    Cb#: [phone]

## 2020-08-21 ENCOUNTER — Other Ambulatory Visit: Payer: Self-pay

## 2020-08-21 MED ORDER — HYDROCODONE-ACETAMINOPHEN 7.5-325 MG PO TABS: 1.0000 | ORAL_TABLET | Freq: Four times a day (QID) | ORAL | 0 refills | Status: DC | PRN

## 2020-08-21 NOTE — Telephone Encounter (Signed)
Ok to refill??  Last office visit 02/28/2020.  Last refill 07/21/2020.

## 2020-08-21 NOTE — Telephone Encounter (Signed)
Pt called in requesting a refill of HYDROcodone-acetaminophen (NORCO) 7.5-325 MG tablet sent to pharmacy. Please advise  Cb#:[phone]

## 2020-09-18 ENCOUNTER — Other Ambulatory Visit: Payer: Self-pay

## 2020-09-18 MED ORDER — HYDROCODONE-ACETAMINOPHEN 7.5-325 MG PO TABS: 1.0000 | ORAL_TABLET | Freq: Four times a day (QID) | ORAL | 0 refills | Status: DC | PRN

## 2020-09-18 NOTE — Telephone Encounter (Signed)
Pt called in requesting a refill of HYDROcodone-acetaminophen (NORCO) 7.5-325 MG tablet  sent to pharmacy [facility] on [address].  Cb#: [phone]

## 2020-09-18 NOTE — Telephone Encounter (Signed)
Ok to refill??  Last office visit 02/28/2020.  Last refill 08/21/2020.

## 2020-10-16 ENCOUNTER — Other Ambulatory Visit: Payer: Self-pay | Admitting: Family Medicine

## 2020-10-16 ENCOUNTER — Telehealth: Payer: Self-pay

## 2020-10-16 MED ORDER — HYDROCODONE-ACETAMINOPHEN 7.5-325 MG PO TABS: 1.0000 | ORAL_TABLET | Freq: Four times a day (QID) | ORAL | 0 refills | Status: DC | PRN

## 2020-10-16 NOTE — Telephone Encounter (Signed)
Pt called in requesting a refill of HYDROcodone-acetaminophen (NORCO) 7.5-325 MG tablet   Cb#: [phone]

## 2020-10-16 NOTE — Telephone Encounter (Signed)
Last seen 02/28/20 Last filled 09/18/2020

## 2020-11-16 ENCOUNTER — Other Ambulatory Visit: Payer: Self-pay

## 2020-11-16 MED ORDER — HYDROCODONE-ACETAMINOPHEN 7.5-325 MG PO TABS: 1.0000 | ORAL_TABLET | Freq: Four times a day (QID) | ORAL | 0 refills | Status: DC | PRN

## 2020-11-16 MED ORDER — TIZANIDINE HCL 4 MG PO TABS: ORAL_TABLET | ORAL | 0 refills | Status: DC

## 2020-11-16 NOTE — Telephone Encounter (Signed)
Ok to refill??  Last office visit 02/28/2020.  Last refill 10/16/2020.  Of note, letter sent to patient to schedule F/U.

## 2020-11-16 NOTE — Telephone Encounter (Signed)
Pt called in requesting a refill of HYDROcodone-acetaminophen (NORCO) 7.5-325 MG tablet , and to also inquire about getting  a refill of tiZANidine (ZANAFLEX) 4 MG tablet. Pt states that this med helps with back  spasms.  Cb#: [phone]

## 2020-12-15 ENCOUNTER — Encounter: Payer: Self-pay | Admitting: Family Medicine

## 2020-12-15 ENCOUNTER — Ambulatory Visit (INDEPENDENT_AMBULATORY_CARE_PROVIDER_SITE_OTHER): Payer: Medicare Other | Admitting: Family Medicine

## 2020-12-15 ENCOUNTER — Other Ambulatory Visit: Payer: Self-pay | Admitting: Family Medicine

## 2020-12-15 ENCOUNTER — Other Ambulatory Visit: Payer: Self-pay

## 2020-12-15 VITALS — BP 122/78 | HR 67 | Ht 73.0 in | Wt 279.1 lb

## 2020-12-15 DIAGNOSIS — Z13220 Encounter for screening for lipoid disorders: Secondary | ICD-10-CM

## 2020-12-15 DIAGNOSIS — Z1211 Encounter for screening for malignant neoplasm of colon: Secondary | ICD-10-CM | POA: Diagnosis not present

## 2020-12-15 DIAGNOSIS — Z125 Encounter for screening for malignant neoplasm of prostate: Secondary | ICD-10-CM | POA: Diagnosis not present

## 2020-12-15 DIAGNOSIS — Z5181 Encounter for therapeutic drug level monitoring: Secondary | ICD-10-CM | POA: Diagnosis not present

## 2020-12-15 DIAGNOSIS — L989 Disorder of the skin and subcutaneous tissue, unspecified: Secondary | ICD-10-CM | POA: Diagnosis not present

## 2020-12-15 DIAGNOSIS — M450 Ankylosing spondylitis of multiple sites in spine: Secondary | ICD-10-CM

## 2020-12-15 DIAGNOSIS — R7309 Other abnormal glucose: Secondary | ICD-10-CM | POA: Diagnosis not present

## 2020-12-15 MED ORDER — HYDROCODONE-ACETAMINOPHEN 7.5-325 MG PO TABS: 1.0000 | ORAL_TABLET | Freq: Four times a day (QID) | ORAL | 0 refills | Status: DC | PRN

## 2020-12-15 MED ORDER — TIZANIDINE HCL 4 MG PO TABS: ORAL_TABLET | ORAL | 0 refills | Status: DC

## 2020-12-15 NOTE — Progress Notes (Signed)
Subjective:    Patient ID: Joshua Kramer, male    DOB: [birth year], 65 y.o.   MRN: [id number]  Medication Refill  He is on chronic opiates for his chronic low back pain.  Patient has a history of ankylosing spondylitis. He has tried numerous biologic agents to prevent exacerbations of this under the care of a rheumatologist. He failed many of them either due to cost or secondary infections. He was unable to tolerate enbrel or humira.  He tried sulfasalazine in the past without any benefit. He is currently using Norco 7.5/325 one 4 times a day.  Patient states that his disease is progressing.  He reports constant severe pain in his back and his neck.  He states that he can feel his body fusing together.  However he states that the rheumatologist do nothing to help him and that there is nothing they can do.  Therefore he declines to see another rheumatologist.  He states that his pain is uncontrolled.  We had a long discussion today about seeing another pain clinic to better manage his pain however at the present time he is not interested in going to a pain clinic.  I have explained to the patient that I do not feel comfortable increasing his hydrocodone beyond its current dose and frequency.  He is overdue for colonoscopy as I have mentioned in the past.  He is overdue for prostate cancer screening and fasting lab work.  He is due for a flu shot, pneumonia vaccine, and a COVID shot today but he politely refuses all of these despite my encouragement. Past Medical History:  Diagnosis Date   Ankylosing spondylitis of multiple sites in spine (HCC)    Chest pain 05/2015   a. abnormal nuc stress test with normal cors and LV function on subsequent cath   History of tobacco abuse    Past Surgical History:  Procedure Laterality Date   CARDIAC CATHETERIZATION  2011   nl cors, nl EF   CARDIAC CATHETERIZATION N/A 06/02/2015   Procedure: Left Heart Cath and Coronary Angiography;  Surgeon: Lennette Bihari, MD;   Location: [facility] INVASIVE CV LAB;  Service: Cardiovascular;  Laterality: N/A;   Current Outpatient Medications on File Prior to Visit  Medication Sig Dispense Refill   atorvastatin (LIPITOR) 40 MG tablet Take 1 tablet (40 mg total) by mouth daily. 90 tablet 3   gabapentin (NEURONTIN) 300 MG capsule Take 1 capsule (300 mg total) by mouth 3 (three) times daily. 30 capsule 0   HYDROcodone-acetaminophen (NORCO) 7.5-325 MG tablet Take 1 tablet by mouth every 6 (six) hours as needed. 120 tablet 0   Thiamine HCl (VITAMIN B-1) 250 MG tablet Take 250 mg by mouth daily.     tiZANidine (ZANAFLEX) 4 MG tablet TAKE 1 TABLET(4 MG) BY MOUTH EVERY 6 HOURS AS NEEDED FOR MUSCLE SPASMS 90 tablet 0   vitamin E 180 MG (400 UNITS) capsule Take 400 Units by mouth daily.     No current facility-administered medications on file prior to visit.   No Known Allergies Social History   Socioeconomic History   Marital status: Married    Spouse name: Not on file   Number of children: 1   Years of education: Not on file   Highest education level: Not on file  Occupational History   Occupation: Sales  Tobacco Use   Smoking status: Former    Types: Cigarettes    Quit date: 06/18/2014    Years since quitting: 6.4  Smokeless tobacco: Never  Substance and Sexual Activity   Alcohol use: No    Alcohol/week: 0.0 standard drinks   Drug use: No   Sexual activity: Yes  Other Topics Concern   Not on file  Social History Narrative   Not on file   Social Determinants of Health   Financial Resource Strain: Not on file  Food Insecurity: Not on file  Transportation Needs: Not on file  Physical Activity: Not on file  Stress: Not on file  Social Connections: Not on file  Intimate Partner Violence: Not on file      Review of Systems  All other systems reviewed and are negative.     Objective:   Physical Exam Vitals reviewed.  Constitutional:      General: He is not in acute distress.    Appearance: Normal  appearance. He is not ill-appearing or toxic-appearing.  Cardiovascular:     Rate and Rhythm: Normal rate and regular rhythm.     Heart sounds: Normal heart sounds. No murmur heard. Pulmonary:     Effort: Pulmonary effort is normal. No respiratory distress.     Breath sounds: Normal breath sounds. No stridor. No wheezing, rhonchi or rales.  Musculoskeletal:     Cervical back: Rigidity and tenderness present. Pain with movement present. Decreased range of motion.     Thoracic back: Tenderness present. Decreased range of motion.     Lumbar back: Tenderness present. Decreased range of motion.  Neurological:     Mental Status: He is alert.    There is a crater like skin lesion on his nasal bridge that I am concerned could be a squamous cell carcinoma      Assessment & Plan:  Colon cancer screening - Plan: Cologuard  Prostate cancer screening - Plan: PSA  Screening cholesterol level - Plan: CBC with Differential/Platelet, COMPLETE METABOLIC PANEL WITH GFR, Lipid panel  Ankylosing spondylitis of multiple sites in spine (HCC) - Plan: DRUG MONITOR, PANEL 1, W/CONF, URINE  Lesion of skin of nose  I will again schedule the patient for Cologuard.  He is overdue for colon cancer screening and declines a colonoscopy.  I will screen for prostate cancer with a PSA.  I will monitor his cholesterol with a CBC CMP and a lipid panel.  I strongly recommended a flu shot, COVID-vaccine, and a pneumonia vaccine but he politely declined the.  I will get a urine drug screen to rule out drugs of abuse.  If urine drug screen is consistent with his current medication regimen, I will continue to prescribe his hydrocodone 4 times a day.  However if he needs to escalate for pain control I would recommend a pain clinic.  I am concerned that the lesion on his nose represents a skin cancer.  There is also an atypical mole on the right side of his neck below his ear.  I believe both of these warrant biopsies and I have  recommended a dermatologist and explained my concern to the patient.  He agrees to a dermatology consult

## 2020-12-15 NOTE — Telephone Encounter (Signed)
LOV 12/15/20 Last refill: Norco 11/16/20, #120, 0 refills Tizanidine 11/16/20, #90, 0 refills  Please review, thanks!

## 2020-12-15 NOTE — Telephone Encounter (Signed)
Patient forgot to ask for refills on his  HYDROcodone-acetaminophen (NORCO) 7.5-325 MG tablet [[id number]]    Order Details Dose: 1 tablet Route: Oral Frequency: Every 6 hours PRN  Dispense Quantity: 120 tablet Refills: 0        Sig: Take 1 tablet by mouth every 6 (six) hours as needed.       tiZANidine (ZANAFLEX) 4 MG tablet [[id number]]    Order Details Dose, Route, Frequency: As Directed  Dispense Quantity: 90 tablet Refills: 0        Sig: TAKE 1 TABLET(4 MG) BY MOUTH EVERY 6 HOURS AS NEEDED FOR MUSCLE SPASMS

## 2020-12-18 LAB — DRUG MONITOR, PANEL 1, W/CONF, URINE
Amphetamines: NEGATIVE ng/mL (ref <500)
Barbiturates: NEGATIVE ng/mL (ref <300)
Benzodiazepines: NEGATIVE ng/mL (ref <100)
Cocaine Metabolite: NEGATIVE ng/mL (ref <150)
Codeine: NEGATIVE ng/mL (ref <50)
Creatinine: 49.3 mg/dL (ref >=20.0)
Hydrocodone: 328 ng/mL — ABNORMAL HIGH (ref <50)
Hydromorphone: 163 ng/mL — ABNORMAL HIGH (ref <50)
Marijuana Metabolite: NEGATIVE ng/mL (ref <20)
Methadone Metabolite: NEGATIVE ng/mL (ref <100)
Morphine: NEGATIVE ng/mL (ref <50)
Norhydrocodone: 301 ng/mL — ABNORMAL HIGH (ref <50)
Opiates: POSITIVE ng/mL — AB (ref <100)
Oxidant: NEGATIVE ug/mL (ref <200)
Oxycodone: NEGATIVE ng/mL (ref <100)
Phencyclidine: NEGATIVE ng/mL (ref <25)
pH: 7.3 (ref 4.5–9.0)

## 2020-12-18 LAB — DM TEMPLATE

## 2020-12-19 LAB — CBC WITH DIFFERENTIAL/PLATELET
Absolute Monocytes: 684 cells/uL (ref 200–950)
Basophils Absolute: 46 cells/uL (ref 0–200)
Basophils Relative: 0.4 %
Eosinophils Absolute: 58 cells/uL (ref 15–500)
Eosinophils Relative: 0.5 %
HCT: 47.6 % (ref 38.5–50.0)
Hemoglobin: 15.9 g/dL (ref 13.2–17.1)
Lymphs Abs: 2088 cells/uL (ref 850–3900)
MCH: 30.3 pg (ref 27.0–33.0)
MCHC: 33.4 g/dL (ref 32.0–36.0)
MCV: 90.8 fL (ref 80.0–100.0)
MPV: 10 fL (ref 7.5–12.5)
Monocytes Relative: 5.9 %
Neutro Abs: 8723 cells/uL — ABNORMAL HIGH (ref 1500–7800)
Neutrophils Relative %: 75.2 %
Platelets: 257 10*3/uL (ref 140–400)
RBC: 5.24 10*6/uL (ref 4.20–5.80)
RDW: 13.1 % (ref 11.0–15.0)
Total Lymphocyte: 18 %
WBC: 11.6 10*3/uL — ABNORMAL HIGH (ref 3.8–10.8)

## 2020-12-19 LAB — COMPLETE METABOLIC PANEL WITH GFR
AG Ratio: 1.5 (calc) (ref 1.0–2.5)
ALT: 19 U/L (ref 9–46)
AST: 11 U/L (ref 10–35)
Albumin: 4.3 g/dL (ref 3.6–5.1)
Alkaline phosphatase (APISO): 79 U/L (ref 35–144)
BUN: 18 mg/dL (ref 7–25)
CO2: 29 mmol/L (ref 20–32)
Calcium: 9.5 mg/dL (ref 8.6–10.3)
Chloride: 101 mmol/L (ref 98–110)
Creat: 0.74 mg/dL (ref 0.70–1.35)
Globulin: 2.8 g/dL (calc) (ref 1.9–3.7)
Glucose, Bld: 130 mg/dL — ABNORMAL HIGH (ref 65–99)
Potassium: 4.6 mmol/L (ref 3.5–5.3)
Sodium: 139 mmol/L (ref 135–146)
Total Bilirubin: 0.7 mg/dL (ref 0.2–1.2)
Total Protein: 7.1 g/dL (ref 6.1–8.1)
eGFR: 101 mL/min/{1.73_m2} (ref >=60)

## 2020-12-19 LAB — LIPID PANEL
Cholesterol: 176 mg/dL (ref <200)
HDL: 42 mg/dL (ref >=40)
LDL Cholesterol (Calc): 112 mg/dL (calc) — ABNORMAL HIGH
Non-HDL Cholesterol (Calc): 134 mg/dL (calc) — ABNORMAL HIGH (ref <130)
Total CHOL/HDL Ratio: 4.2 (calc) (ref <5.0)
Triglycerides: 109 mg/dL (ref <150)

## 2020-12-19 LAB — TEST AUTHORIZATION

## 2020-12-19 LAB — PSA: PSA: 0.27 ng/mL (ref <=4.00)

## 2020-12-19 LAB — HEMOGLOBIN A1C W/OUT EAG: Hgb A1c MFr Bld: 5.9 % of total Hgb — ABNORMAL HIGH (ref <5.7)

## 2020-12-31 ENCOUNTER — Telehealth: Payer: Self-pay | Admitting: Dermatology

## 2020-12-31 NOTE — Telephone Encounter (Signed)
Patient is calling for a referral appointment from Lynnea Ferrier, M.D.  Patient is scheduled for 07/06/2021 at 10:00 with Janalyn Harder, M.D.

## 2020-12-31 NOTE — Telephone Encounter (Signed)
Referral attached to appointment

## 2021-01-02 ENCOUNTER — Other Ambulatory Visit: Payer: Self-pay | Admitting: Family Medicine

## 2021-01-13 ENCOUNTER — Telehealth: Payer: Self-pay

## 2021-01-13 NOTE — Telephone Encounter (Signed)
Pt called in requesting a refill of HYDROcodone-acetaminophen (NORCO) 7.5-325 MG tablet sent to pharmacy.  Cb#: [phone]

## 2021-01-14 ENCOUNTER — Other Ambulatory Visit: Payer: Self-pay | Admitting: Family Medicine

## 2021-01-14 MED ORDER — HYDROCODONE-ACETAMINOPHEN 7.5-325 MG PO TABS
1.0000 | ORAL_TABLET | Freq: Four times a day (QID) | ORAL | 0 refills | Status: DC | PRN
Start: 2021-01-14 — End: 2021-02-16

## 2021-01-30 DIAGNOSIS — Z1211 Encounter for screening for malignant neoplasm of colon: Secondary | ICD-10-CM | POA: Diagnosis not present

## 2021-02-05 LAB — COLOGUARD: COLOGUARD: POSITIVE — AB

## 2021-02-11 ENCOUNTER — Telehealth: Payer: Self-pay

## 2021-02-11 DIAGNOSIS — R195 Other fecal abnormalities: Secondary | ICD-10-CM

## 2021-02-11 NOTE — Telephone Encounter (Signed)
Second attempt to reach patient regarding results and recommendations.  Left message. Referral placed.

## 2021-02-11 NOTE — Telephone Encounter (Signed)
-----   Message from Judd Gaudier, CMA sent at 02/08/2021 12:49 PM EST ----- Left message for patient to call back.

## 2021-02-16 ENCOUNTER — Other Ambulatory Visit: Payer: Self-pay | Admitting: Family Medicine

## 2021-02-16 ENCOUNTER — Telehealth: Payer: Self-pay

## 2021-02-16 MED ORDER — HYDROCODONE-ACETAMINOPHEN 7.5-325 MG PO TABS: 1.0000 | ORAL_TABLET | Freq: Four times a day (QID) | ORAL | 0 refills | Status: DC | PRN

## 2021-02-16 NOTE — Telephone Encounter (Signed)
Pt called in requesting a refill of HYDROcodone-acetaminophen (NORCO) 7.5-325 MG tablet   Cb#: [phone] 

## 2021-02-16 NOTE — Telephone Encounter (Signed)
LOV 12/15/20 Last refill 01/14/21, #120, 0 refills  Please review, thanks!

## 2021-02-19 ENCOUNTER — Other Ambulatory Visit: Payer: Self-pay

## 2021-02-19 ENCOUNTER — Encounter: Payer: Self-pay | Admitting: Family Medicine

## 2021-02-19 ENCOUNTER — Ambulatory Visit (INDEPENDENT_AMBULATORY_CARE_PROVIDER_SITE_OTHER): Payer: Medicare Other | Admitting: Family Medicine

## 2021-02-19 VITALS — BP 152/82 | HR 61 | Temp 97.8°F | Resp 18 | Ht 73.0 in | Wt 276.0 lb

## 2021-02-19 DIAGNOSIS — H6123 Impacted cerumen, bilateral: Secondary | ICD-10-CM | POA: Diagnosis not present
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
## 2021-03-16 ENCOUNTER — Other Ambulatory Visit: Payer: Self-pay

## 2021-03-16 NOTE — Telephone Encounter (Signed)
LOV 02/19/21 Last refill 02/16/21, #120, 0 refills  Please review, thanks!

## 2021-03-16 NOTE — Telephone Encounter (Signed)
Pt called in to request a refill of HYDROcodone-acetaminophen (NORCO) 7.5-325 MG tablet

## 2021-03-17 MED ORDER — HYDROCODONE-ACETAMINOPHEN 7.5-325 MG PO TABS: 1.0000 | ORAL_TABLET | Freq: Four times a day (QID) | ORAL | 0 refills | Status: DC | PRN

## 2021-03-29 ENCOUNTER — Other Ambulatory Visit: Payer: Self-pay | Admitting: Family Medicine

## 2021-04-16 ENCOUNTER — Other Ambulatory Visit: Payer: Self-pay

## 2021-04-16 DIAGNOSIS — M450 Ankylosing spondylitis of multiple sites in spine: Secondary | ICD-10-CM

## 2021-04-16 NOTE — Telephone Encounter (Signed)
Rx request sent to provider for review. PA for Norco is not due yet: ?Received PA determination ?PA [id number] approved 06/17/2020- 06/23/2021 ? ? ?LOV 02/19/21 ?Last refill 03/17/21, #120, 0 refills ? ?Please review, thanks! ? ?

## 2021-04-16 NOTE — Telephone Encounter (Signed)
Pt called in requesting a refill of HYDROcodone-acetaminophen (NORCO) 7.5-325 MG tablet. Pt also asked to make sure that a prior authorization is made for this med due to his insurance please. Please advise. ? ?Cb#: [phone] ? ?

## 2021-04-19 MED ORDER — HYDROCODONE-ACETAMINOPHEN 7.5-325 MG PO TABS: 1.0000 | ORAL_TABLET | Freq: Four times a day (QID) | ORAL | 0 refills | Status: DC | PRN

## 2021-05-17 ENCOUNTER — Other Ambulatory Visit: Payer: Self-pay

## 2021-05-17 DIAGNOSIS — M450 Ankylosing spondylitis of multiple sites in spine: Secondary | ICD-10-CM

## 2021-05-17 NOTE — Telephone Encounter (Signed)
Pt called in to request a refill of HYDROcodone-acetaminophen (NORCO) 7.5-325 MG tablet. Please advise. ? ?Cb#: [phone] ? ?

## 2021-05-18 NOTE — Telephone Encounter (Signed)
Requested medications are due for refill today.  Provider to determine. ? ?Requested medications are on the active medications list.  yes ? ?Last refill. 04/19/2021 #120 0 refills ? ?Future visit scheduled.   no ? ?Notes to clinic.  Medication refill is not delegated. ? ? ? ?Requested Prescriptions  ?Pending Prescriptions Disp Refills  ? HYDROcodone-acetaminophen (NORCO) 7.5-325 MG tablet 120 tablet 0  ?  Sig: Take 1 tablet by mouth every 6 (six) hours as needed.  ?  ? Not Delegated - Analgesics:  Opioid Agonist Combinations Failed - 05/17/2021 11:26 AM  ?  ?  Failed - This refill cannot be delegated  ?  ?  Failed - Urine Drug Screen completed in last 360 days  ?  ?  Passed - Valid encounter within last 3 months  ?  Recent Outpatient Visits   ? ?      ? 2 months ago Bilateral impacted cerumen  ? [facility] Pickard, Priscille Heidelberg, MD  ? 5 months ago Colon cancer screening  ? [facility] Pickard, Priscille Heidelberg, MD  ? 1 year ago Screening cholesterol level  ? [facility] Pickard, Priscille Heidelberg, MD  ? 2 years ago Pure hypercholesterolemia  ? [facility] Pickard, Priscille Heidelberg, MD  ? 2 years ago Screening cholesterol level  ? [facility] Pickard, Priscille Heidelberg, MD  ? ?  ?  ?Future Appointments   ? ?        ? In 1 month Janalyn Harder, MD [facility]  ? ?  ? ? ?  ?  ?  ?  ?

## 2021-05-19 ENCOUNTER — Telehealth: Payer: Self-pay | Admitting: Family Medicine

## 2021-05-19 NOTE — Telephone Encounter (Signed)
Left message for patient to call back and schedule Medicare Annual Wellness Visit (AWV) in office.  ° °If not able to come in office, please offer to do virtually or by telephone.  Left office number and my jabber #[phone]. ° °Due for AWVI ° °Please schedule at anytime with Nurse Health Advisor. °  °

## 2021-05-20 MED ORDER — HYDROCODONE-ACETAMINOPHEN 7.5-325 MG PO TABS: 1.0000 | ORAL_TABLET | Freq: Four times a day (QID) | ORAL | 0 refills | Status: DC | PRN

## 2021-05-20 NOTE — Telephone Encounter (Signed)
LOV 02/19/21 ?Last refill 04/19/21, #120, 0 refills ? ?Please review, thanks! ? ?

## 2021-06-16 ENCOUNTER — Other Ambulatory Visit: Payer: Self-pay

## 2021-06-16 DIAGNOSIS — M450 Ankylosing spondylitis of multiple sites in spine: Secondary | ICD-10-CM

## 2021-06-16 NOTE — Telephone Encounter (Signed)
Pt called in requesting a refill of HYDROcodone-acetaminophen (NORCO) 7.5-325 MG tablet and tiZANidine (ZANAFLEX) 4 MG table to be sent to the [facility] pharmacy on [address] in [place].   Cb#: [phone]

## 2021-06-17 NOTE — Telephone Encounter (Signed)
Requested medication (s) are due for refill today: yes  Requested medication (s) are on the active medication list: yes  Last refill:  zanaflex- 03/30/21 #90 0 refills, hydrocodone -05/20/21 #120 0 refills  Future visit scheduled: no  Notes to clinic:  not delegated per protocol, needs appt .      Requested Prescriptions  Pending Prescriptions Disp Refills   tiZANidine (ZANAFLEX) 4 MG tablet 90 tablet 0     Not Delegated - Cardiovascular:  Alpha-2 Agonists - tizanidine Failed - 06/16/2021 12:50 PM      Failed - This refill cannot be delegated      Passed - Valid encounter within last 6 months    Recent Outpatient Visits           3 months ago Bilateral impacted cerumen   [facility] Tanya Nones, Priscille Heidelberg, MD   6 months ago Colon cancer screening   [facility] Pickard, Priscille Heidelberg, MD   1 year ago Screening cholesterol level   [facility] Pickard, Priscille Heidelberg, MD   2 years ago Pure hypercholesterolemia   [facility] Tanya Nones, Priscille Heidelberg, MD   2 years ago Screening cholesterol level   [facility] Pickard, Priscille Heidelberg, MD       Future Appointments             In 2 weeks Janalyn Harder, MD [facility]              HYDROcodone-acetaminophen (NORCO) 7.5-325 MG tablet 120 tablet 0    Sig: Take 1 tablet by mouth every 6 (six) hours as needed.     Not Delegated - Analgesics:  Opioid Agonist Combinations Failed - 06/16/2021 12:50 PM      Failed - This refill cannot be delegated      Failed - Urine Drug Screen completed in last 360 days      Failed - Valid encounter within last 3 months    Recent Outpatient Visits           3 months ago Bilateral impacted cerumen   [facility] Donita Brooks, MD   6 months ago Colon cancer screening   [facility] Donita Brooks, MD   1 year ago Screening cholesterol level   [facility] Pickard, Priscille Heidelberg, MD   2 years ago Pure hypercholesterolemia   [facility] Donita Brooks, MD   2 years ago Screening cholesterol level   [facility] Pickard, Priscille Heidelberg, MD       Future Appointments             In 2 weeks Janalyn Harder, MD [facility]

## 2021-06-18 MED ORDER — HYDROCODONE-ACETAMINOPHEN 7.5-325 MG PO TABS: 1.0000 | ORAL_TABLET | Freq: Four times a day (QID) | ORAL | 0 refills | Status: DC | PRN

## 2021-06-18 MED ORDER — TIZANIDINE HCL 4 MG PO TABS: 4.0000 mg | ORAL_TABLET | Freq: Three times a day (TID) | ORAL | 0 refills | Status: DC | PRN

## 2021-06-18 NOTE — Telephone Encounter (Signed)
Hydro-  LOV 02/19/21 Last refill 05/20/21, #120, 0 refills  LOV 02/19/21 Last refill 03/30/21, #90, 0 refills  Please review, thanks!    Please review, thanks!

## 2021-07-06 ENCOUNTER — Ambulatory Visit: Payer: Medicare Other | Admitting: Dermatology

## 2021-07-14 ENCOUNTER — Telehealth: Payer: Self-pay | Admitting: Family Medicine

## 2021-07-14 NOTE — Telephone Encounter (Signed)
Patient is requesting refill on two medications. He knows that the long weekend is coming up and he had them last filled on 06/18/2021 he uses [facility] on [address] HYDROcodone-acetaminophen (NORCO) 7.5-325 MG tablet [[id number]]    Order Details Dose: 1 tablet Route: Oral Frequency: Every 6 hours PRN  Dispense Quantity: 120 tablet Refills: 0        Sig: Take 1 tablet by mouth every 6 (six) hours as needed.       Start Date: 06/18/21 End Date: --  Written Date: 06/18/21 Expiration Date: 12/15/21  Earliest Fill Date: 06/18/21       Associated Diagnoses: Ankylosing spondylitis of multiple sites in spine [facility]) [M45.0]  Original Order:  HYDROcodone-acetaminophen (NORCO) 7.5-325 MG tablet [[id number]]  Providers  Authorizing Provider:   Donita Brooks, MD  [address] [place], [place] [address]  Phone:  [phone]   Fax:  [phone]  DEA #:  [id number]   NPI:  [id number]      Ordering User:  Donita Brooks, MD       Pharmacy  [facility] [phone] - [place], [place] - [address] AT [facility] [address] [facility] [address] [facility] [address]   [address], [place] [address]  Phone:  [phone]  Fax:  [phone]    tiZANidine (ZANAFLEX) 4 MG tablet [[id number]]    Order Details Dose: 4 mg Route: Oral Frequency: Every 8 hours PRN for muscle spasms  Dispense Quantity: 90 tablet Refills: 0        Sig: Take 1 tablet (4 mg total) by mouth every 8 (eight) hours as needed for muscle spasms.       Start Date: 06/18/21 End Date: --  Written Date: 06/18/21 Expiration Date: 06/18/22  Original Order:  tiZANidine (ZANAFLEX) 4 MG tablet [[id number]]  Providers  Authorizing Provider:   Donita Brooks, MD  [address] [place], [place] [address]  Phone:  [phone]   Fax:  [phone]  DEA #:  [id number]   NPI:  [id number]      Ordering User:  Donita Brooks, MD

## 2021-07-15 ENCOUNTER — Other Ambulatory Visit: Payer: Self-pay | Admitting: Family Medicine

## 2021-07-15 DIAGNOSIS — M450 Ankylosing spondylitis of multiple sites in spine: Secondary | ICD-10-CM

## 2021-07-15 MED ORDER — HYDROCODONE-ACETAMINOPHEN 7.5-325 MG PO TABS: 1.0000 | ORAL_TABLET | Freq: Four times a day (QID) | ORAL | 0 refills | Status: DC | PRN

## 2021-07-15 MED ORDER — TIZANIDINE HCL 4 MG PO TABS
4.0000 mg | ORAL_TABLET | Freq: Three times a day (TID) | ORAL | 0 refills | Status: DC | PRN
Start: 2021-07-15 — End: 2021-08-30

## 2021-08-11 ENCOUNTER — Telehealth: Payer: Self-pay

## 2021-08-11 NOTE — Telephone Encounter (Signed)
Pt called in requesting a refill of this med HYDROcodone-acetaminophen (NORCO) 7.5-325 MG tablet [[id number]]. Pt is aware that his refill is scheduled for 7/30. Pt also wanted to know if he needed a PA for this med.  LOV: 02/19/21  Cb#: [phone]

## 2021-08-12 ENCOUNTER — Other Ambulatory Visit: Payer: Self-pay | Admitting: Family Medicine

## 2021-08-12 DIAGNOSIS — M450 Ankylosing spondylitis of multiple sites in spine: Secondary | ICD-10-CM

## 2021-08-12 MED ORDER — HYDROCODONE-ACETAMINOPHEN 7.5-325 MG PO TABS: 1.0000 | ORAL_TABLET | Freq: Four times a day (QID) | ORAL | 0 refills | Status: DC | PRN

## 2021-08-27 ENCOUNTER — Other Ambulatory Visit: Payer: Self-pay | Admitting: Family Medicine

## 2021-08-27 NOTE — Telephone Encounter (Signed)
Requested medication (s) are due for refill today: yes  Requested medication (s) are on the active medication list: yes  Last refill:  07/15/21 #90/0  Future visit scheduled: no  Notes to clinic:  Unable to refill per protocol, cannot delegate.      Requested Prescriptions  Pending Prescriptions Disp Refills   tiZANidine (ZANAFLEX) 4 MG tablet [Pharmacy Med Name: TIZANIDINE 4MG  TABLETS] 90 tablet 0    Sig: TAKE 1 TABLET(4 MG) BY MOUTH EVERY 8 HOURS AS NEEDED FOR MUSCLE SPASMS     Not Delegated - Cardiovascular:  Alpha-2 Agonists - tizanidine Failed - 08/27/2021  4:20 PM      Failed - This refill cannot be delegated      Failed - Valid encounter within last 6 months    Recent Outpatient Visits           6 months ago Bilateral impacted cerumen   [facility]   8 months ago Colon cancer screening   [facility], MD   1 year ago Screening cholesterol level   [facility] Pickard, [facility], MD   2 years ago Pure hypercholesterolemia   [facility], Tanya Nones, MD   2 years ago Screening cholesterol level   [facility] Pickard, [facility], MD

## 2021-09-13 ENCOUNTER — Telehealth: Payer: Self-pay

## 2021-09-13 ENCOUNTER — Other Ambulatory Visit: Payer: Self-pay | Admitting: Family Medicine

## 2021-09-13 DIAGNOSIS — M450 Ankylosing spondylitis of multiple sites in spine: Secondary | ICD-10-CM

## 2021-09-13 MED ORDER — HYDROCODONE-ACETAMINOPHEN 7.5-325 MG PO TABS: 1.0000 | ORAL_TABLET | Freq: Four times a day (QID) | ORAL | 0 refills | Status: DC | PRN

## 2021-09-13 NOTE — Telephone Encounter (Signed)
Pt called in to request a refill of this med HYDROcodone-acetaminophen (NORCO) 7.5-325 MG tablet [[id number]] sent to pharmacy.   LOV: 02/19/21  [facility] on [address]  Cb#: [phone]

## 2021-09-27 ENCOUNTER — Other Ambulatory Visit: Payer: Self-pay | Admitting: Family Medicine

## 2021-10-12 ENCOUNTER — Telehealth: Payer: Self-pay

## 2021-10-12 NOTE — Telephone Encounter (Signed)
Pt called in requesting a refill of  HYDROcodone-acetaminophen ([place]) 7.5-325 MG tablet [[id number]]    Order Details Dose: 1 tablet Route: Oral Frequency: Every 6 hours PRN  Dispense Quantity: 120 tablet Refills: 0         LOV: 02/19/21  PH[facility]ACY: [facility] ON [address]

## 2021-10-13 NOTE — Telephone Encounter (Signed)
Patient called to follow up on refill; stated he will be out of medication tomorrow.   Please advise at [phone].

## 2021-10-14 ENCOUNTER — Other Ambulatory Visit: Payer: Self-pay | Admitting: Family Medicine

## 2021-10-14 DIAGNOSIS — M450 Ankylosing spondylitis of multiple sites in spine: Secondary | ICD-10-CM

## 2021-10-14 MED ORDER — HYDROCODONE-ACETAMINOPHEN 7.5-325 MG PO TABS: 1.0000 | ORAL_TABLET | Freq: Four times a day (QID) | ORAL | 0 refills | Status: DC | PRN

## 2021-10-26 ENCOUNTER — Other Ambulatory Visit: Payer: Self-pay | Admitting: Family Medicine

## 2021-10-26 NOTE — Telephone Encounter (Signed)
Requested medication (s) are due for refill today - yes  Requested medication (s) are on the active medication list -yes  Future visit scheduled -no  Last refill: 09/27/21 #90   Notes to clinic: non delegated Rx  Requested Prescriptions  Pending Prescriptions Disp Refills   tiZANidine (ZANAFLEX) 4 MG tablet [Pharmacy Med Name: TIZANIDINE 4MG  TABLETS] 90 tablet 0    Sig: TAKE 1 TABLET(4 MG) BY MOUTH EVERY 8 HOURS AS NEEDED FOR MUSCLE SPASMS     Not Delegated - Cardiovascular:  Alpha-2 Agonists - tizanidine Failed - 10/26/2021  3:29 AM      Failed - This refill cannot be delegated      Failed - Valid encounter within last 6 months    Recent Outpatient Visits           8 months ago Bilateral impacted cerumen   [place] Susy Frizzle, MD   10 months ago Colon cancer screening   [place] Pickard, Cammie Mcgee, MD   1 year ago Screening cholesterol level   [place] Pickard, Cammie Mcgee, MD   2 years ago Pure hypercholesterolemia   [place] Schaumann, Cammie Mcgee, MD   2 years ago Screening cholesterol level   [place] Pickard, Cammie Mcgee, MD                 Requested Prescriptions  Pending Prescriptions Disp Refills   tiZANidine (ZANAFLEX) 4 MG tablet [Pharmacy Med Name: TIZANIDINE 4MG  TABLETS] 90 tablet 0    Sig: TAKE 1 TABLET(4 MG) BY MOUTH EVERY 8 HOURS AS NEEDED FOR MUSCLE SPASMS     Not Delegated - Cardiovascular:  Alpha-2 Agonists - tizanidine Failed - 10/26/2021  3:29 AM      Failed - This refill cannot be delegated      Failed - Valid encounter within last 6 months    Recent Outpatient Visits           8 months ago Bilateral impacted cerumen   [place] Susy Frizzle, MD   10 months ago Colon cancer screening   [place] Pickard, Cammie Mcgee, MD   1 year ago Screening cholesterol level   [place] Pickard,  Cammie Mcgee, MD   2 years ago Pure hypercholesterolemia   [place] Schaumann, Cammie Mcgee, MD   2 years ago Screening cholesterol level   [place] Pickard, Cammie Mcgee, MD

## 2021-11-10 ENCOUNTER — Telehealth: Payer: Self-pay

## 2021-11-10 NOTE — Telephone Encounter (Signed)
Pt called into request a refill of: HYDROcodone-acetaminophen (NORCO) 7.5-325 MG tablet [[id number]]    Order Details Dose: 1 tablet Route: Oral Frequency: Every 6 hours PRN  Dispense Quantity: 120 tablet Refills: 0        Sig: Take 1 tablet by mouth every 6 (six) hours as needed.       Start Date: 10/14/21 End Date: --  Written Date: 10/14/21 Expiration Date: 04/12/22   LOV: 02/18/21  PHARMACY: [facility] [place],  - [address] AT   Pt also asks if the prior authorization could be sent in as well please.

## 2021-11-11 ENCOUNTER — Other Ambulatory Visit: Payer: Self-pay | Admitting: Family Medicine

## 2021-11-11 DIAGNOSIS — M450 Ankylosing spondylitis of multiple sites in spine: Secondary | ICD-10-CM

## 2021-11-11 MED ORDER — HYDROCODONE-ACETAMINOPHEN 7.5-325 MG PO TABS: 1.0000 | ORAL_TABLET | Freq: Four times a day (QID) | ORAL | 0 refills | Status: DC | PRN

## 2021-12-02 ENCOUNTER — Other Ambulatory Visit: Payer: Self-pay | Admitting: Family Medicine

## 2021-12-02 NOTE — Telephone Encounter (Signed)
Requested medications are due for refill today.  Provider to decide.  Requested medications are on the active medications list.  yes  Last refill. 10/28/2021 #90 0 rf  Future visit scheduled.   no  Notes to clinic.  Refill not delegated.    Requested Prescriptions  Pending Prescriptions Disp Refills   tiZANidine (ZANAFLEX) 4 MG tablet [Pharmacy Med Name: TIZANIDINE 4MG  TABLETS] 90 tablet 0    Sig: TAKE 1 TABLET(4 MG) BY MOUTH EVERY 8 HOURS AS NEEDED FOR MUSCLE SPASMS     Not Delegated - Cardiovascular:  Alpha-2 Agonists - tizanidine Failed - 12/02/2021  3:29 AM      Failed - This refill cannot be delegated      Failed - Valid encounter within last 6 months    Recent Outpatient Visits           9 months ago Bilateral impacted cerumen   [facility], MD   11 months ago Colon cancer screening   [facility] Pickard, [facility], MD   1 year ago Screening cholesterol level   [facility] Pickard, [facility], MD   2 years ago Pure hypercholesterolemia   [facility], Tanya Nones, MD   2 years ago Screening cholesterol level   [facility] Pickard, [facility], MD

## 2021-12-08 ENCOUNTER — Other Ambulatory Visit: Payer: Self-pay | Admitting: Family Medicine

## 2021-12-08 ENCOUNTER — Telehealth: Payer: Self-pay

## 2021-12-08 DIAGNOSIS — M450 Ankylosing spondylitis of multiple sites in spine: Secondary | ICD-10-CM

## 2021-12-08 MED ORDER — HYDROCODONE-ACETAMINOPHEN 7.5-325 MG PO TABS: 1.0000 | ORAL_TABLET | Freq: Four times a day (QID) | ORAL | 0 refills | Status: DC | PRN

## 2021-12-08 NOTE — Telephone Encounter (Signed)
Pt called in requesting a refill of HYDROcodone-acetaminophen (NORCO) 7.5-325 MG tablet. Pt is aware that refill is not scheduled to be refilled 11/26 which falls on a Sunday. Pt was hoping to get this refilled as he understands that the office will be closed due to the holiday. Please call pt with any questions. Please advise.  Cb#: [phone]   Pharmacy:  [facility] #[id number] - [place],  - [address] AT [facility] OF [address] &

## 2021-12-08 NOTE — Progress Notes (Signed)
PDMP reviewed and will refill per Dr Pickard 

## 2021-12-13 ENCOUNTER — Other Ambulatory Visit: Payer: Self-pay | Admitting: Family Medicine

## 2021-12-13 DIAGNOSIS — M450 Ankylosing spondylitis of multiple sites in spine: Secondary | ICD-10-CM

## 2021-12-13 MED ORDER — HYDROCODONE-ACETAMINOPHEN 7.5-325 MG PO TABS: 1.0000 | ORAL_TABLET | Freq: Four times a day (QID) | ORAL | 0 refills | Status: DC | PRN

## 2021-12-13 NOTE — Telephone Encounter (Signed)
Pt called in to see if he could get his refill completed. Pt received a courtesy refill of 20 tablets of HYDROcodone-acetaminophen, and would like to know if pcp would refill this med for the remainder amount of 120. Please advise.  Cb#: [phone]

## 2021-12-22 ENCOUNTER — Telehealth: Payer: Self-pay | Admitting: Family Medicine

## 2021-12-22 NOTE — Telephone Encounter (Signed)
Left message for patient to call back and schedule Medicare Annual Wellness Visit (AWV) in office.   If not able to come in office, please offer to do virtually or by telephone.   No history of  AWV: Per [place] eligible for AWVI as of  05/17/2021   Please schedule at anytime with [facility] Health Advisor Toni Amend  If any questions, please contact me at [phone]

## 2021-12-30 ENCOUNTER — Other Ambulatory Visit: Payer: Self-pay | Admitting: Family Medicine

## 2021-12-30 NOTE — Telephone Encounter (Signed)
Requested medications are due for refill today.  yes  Requested medications are on the active medications list.  yes  Last refill. 12/02/2021 #90 0 rf  Future visit scheduled.   no  Notes to clinic.  Refill not delegated.    Requested Prescriptions  Pending Prescriptions Disp Refills   tiZANidine (ZANAFLEX) 4 MG tablet [Pharmacy Med Name: TIZANIDINE 4MG  TABLETS] 90 tablet 0    Sig: TAKE 1 TABLET(4 MG) BY MOUTH EVERY 8 HOURS AS NEEDED FOR MUSCLE SPASMS     Not Delegated - Cardiovascular:  Alpha-2 Agonists - tizanidine Failed - 12/30/2021  3:29 AM      Failed - This refill cannot be delegated      Failed - Valid encounter within last 6 months    Recent Outpatient Visits           10 months ago Bilateral impacted cerumen   [facility], MD   1 year ago Colon cancer screening   [facility], MD   1 year ago Screening cholesterol level   [facility] Pickard, [facility], MD   2 years ago Pure hypercholesterolemia   [facility], Tanya Nones, MD   2 years ago Screening cholesterol level   [facility] Pickard, [facility], MD

## 2022-01-11 ENCOUNTER — Other Ambulatory Visit: Payer: Self-pay | Admitting: Family Medicine

## 2022-01-11 ENCOUNTER — Telehealth: Payer: Self-pay | Admitting: Family Medicine

## 2022-01-11 DIAGNOSIS — M450 Ankylosing spondylitis of multiple sites in spine: Secondary | ICD-10-CM

## 2022-01-11 MED ORDER — HYDROCODONE-ACETAMINOPHEN 7.5-325 MG PO TABS: 1.0000 | ORAL_TABLET | Freq: Four times a day (QID) | ORAL | 0 refills | Status: DC | PRN

## 2022-01-11 NOTE — Telephone Encounter (Signed)
Prescription Request  01/11/2022  Is this a "Controlled Substance" medicine? Yes  LOV: Visit date not found  What is the name of the medication or equipment? HYDROcodone-acetaminophen (NORCO) 7.5-325 MG tablet   Have you contacted your pharmacy to request a refill? No   Which pharmacy would you like this sent to?  [id number] [place], [place] - [address] AT [facility] [address] [facility] [address] & [facility] [address] [place] [address] Phone: [phone] Fax: [phone]    Patient notified that their request is being sent to the clinical staff for review and that they should receive a response within 2 business days.   Please advise at [facility] [phone]

## 2022-01-27 ENCOUNTER — Other Ambulatory Visit: Payer: Self-pay | Admitting: Family Medicine

## 2022-01-27 NOTE — Telephone Encounter (Signed)
Requested medication (s) are due for refill today: routing for approval  Requested medication (s) are on the active medication list:yes  Last refill:  12/30/21  Future visit scheduled: yes  Notes to clinic:  Unable to refill per protocol, cannot delegate.      Requested Prescriptions  Pending Prescriptions Disp Refills   tiZANidine (ZANAFLEX) 4 MG tablet [Pharmacy Med Name: TIZANIDINE 4MG  TABLETS] 90 tablet 0    Sig: TAKE 1 TABLET(4 MG) BY MOUTH EVERY 8 HOURS AS NEEDED FOR MUSCLE SPASMS     Not Delegated - Cardiovascular:  Alpha-2 Agonists - tizanidine Failed - 01/27/2022  3:28 AM      Failed - This refill cannot be delegated      Failed - Valid encounter within last 6 months    Recent Outpatient Visits           11 months ago Bilateral impacted cerumen   [place] Susy Frizzle, MD   1 year ago Colon cancer screening   [place] Susy Frizzle, MD   1 year ago Screening cholesterol level   [place] Pickard, Cammie Mcgee, MD   2 years ago Pure hypercholesterolemia   [place], Cammie Mcgee, MD   3 years ago Screening cholesterol level   [place] Pickard, Cammie Mcgee, MD

## 2022-02-07 ENCOUNTER — Telehealth: Payer: Self-pay | Admitting: Family Medicine

## 2022-02-07 NOTE — Telephone Encounter (Signed)
Left message for patient to call back and schedule Medicare Annual Wellness Visit (AWV) in office.   If not able to come in office, please offer to do virtually or by telephone.   No history of  AWV: Per [place] eligible for AWVI as of  05/17/2021   Please schedule at anytime with [facility]  If any questions, please contact me at [phone]  Thank you,  [facility]  Ambulatory Clinical Support for [facility] ??[phone] or ??[phone]

## 2022-02-08 ENCOUNTER — Other Ambulatory Visit: Payer: Self-pay | Admitting: Family Medicine

## 2022-02-08 DIAGNOSIS — M450 Ankylosing spondylitis of multiple sites in spine: Secondary | ICD-10-CM

## 2022-02-08 NOTE — Telephone Encounter (Signed)
HYDROcodone-acetaminophen (NORCO) 7.5-325 MG tablet   [facility] [address]  Patient asking for refill and higher dose.   He asked the Dr Samella Parr nurse to call and discuss with him

## 2022-02-10 ENCOUNTER — Telehealth: Payer: Self-pay | Admitting: Family Medicine

## 2022-02-10 MED ORDER — HYDROCODONE-ACETAMINOPHEN 7.5-325 MG PO TABS: 1.0000 | ORAL_TABLET | Freq: Four times a day (QID) | ORAL | 0 refills | Status: DC | PRN

## 2022-02-10 NOTE — Telephone Encounter (Signed)
Patient called to report his pharmacy needs a prior auth to refill his script for  HYDROcodone-acetaminophen ([place]) 7.5-325 MG tablet [[id number]]   Pharmacy confirmed as   [place] AT [place] [address] Phone: [phone]  Fax: [phone] DEA #: [id number]   Patient is almost out of medication.  Please advise t [phone].

## 2022-03-03 ENCOUNTER — Ambulatory Visit (INDEPENDENT_AMBULATORY_CARE_PROVIDER_SITE_OTHER): Payer: Medicare Other

## 2022-03-03 VITALS — Ht 73.0 in | Wt 275.0 lb

## 2022-03-03 DIAGNOSIS — Z0000 Encounter for general adult medical examination without abnormal findings: Secondary | ICD-10-CM

## 2022-03-03 NOTE — Patient Instructions (Addendum)
Joshua Kramer , Thank you for taking time to come for your Medicare Wellness Visit. I appreciate your ongoing commitment to your health goals. Please review the following plan we discussed and let me know if I can assist you in the future.   These are the goals we discussed:  Goals      Increase physical activity        This is a list of the screening recommended for you and due dates:  Health Maintenance  Topic Date Due   Hepatitis C Screening: USPSTF Recommendation to screen - Ages 67-79 yo.  Never done   COVID-19 Vaccine (1) 03/19/2022*   Flu Shot  04/17/2022*   Zoster (Shingles) Vaccine (1 of 2) 06/01/2022*   Pneumonia Vaccine (1 of 1 - PCV) 03/04/2023*   Colon Cancer Screening  03/04/2023*   Medicare Annual Wellness Visit  03/04/2023   HPV Vaccine  Aged Out   DTaP/Tdap/Td vaccine  Discontinued  *Topic was postponed. The date shown is not the original due date.    Advanced directives: Please bring a copy of your health care power of attorney and living will to the office to be added to your chart at your convenience.   Conditions/risks identified: Aim for 30 minutes of exercise or brisk walking, 6-8 glasses of water, and 5 servings of fruits and vegetables each day.   Next appointment: Follow up in one year for your annual wellness visit.   Preventive Care 67 Years and Older, Male  Preventive care refers to lifestyle choices and visits with your health care provider that can promote health and wellness. What does preventive care include? A yearly physical exam. This is also called an annual well check. Dental exams once or twice a year. Routine eye exams. Ask your health care provider how often you should have your eyes checked. Personal lifestyle choices, including: Daily care of your teeth and gums. Regular physical activity. Eating a healthy diet. Avoiding tobacco and drug use. Limiting alcohol use. Practicing safe sex. Taking low doses of aspirin every day. Taking  vitamin and mineral supplements as recommended by your health care provider. What happens during an annual well check? The services and screenings done by your health care provider during your annual well check will depend on your age, overall health, lifestyle risk factors, and family history of disease. Counseling  Your health care provider may ask you questions about your: Alcohol use. Tobacco use. Drug use. Emotional well-being. Home and relationship well-being. Sexual activity. Eating habits. History of falls. Memory and ability to understand (cognition). Work and work Statistician. Screening  You may have the following tests or measurements: Height, weight, and BMI. Blood pressure. Lipid and cholesterol levels. These may be checked every 5 years, or more frequently if you are over 81 years old. Skin check. Lung cancer screening. You may have this screening every year starting at age 55 if you have a 30-pack-year history of smoking and currently smoke or have quit within the past 15 years. Fecal occult blood test (FOBT) of the stool. You may have this test every year starting at age 74. Flexible sigmoidoscopy or colonoscopy. You may have a sigmoidoscopy every 5 years or a colonoscopy every 10 years starting at age 33. Prostate cancer screening. Recommendations will vary depending on your family history and other risks. Hepatitis C blood test. Hepatitis B blood test. Sexually transmitted disease (STD) testing. Diabetes screening. This is done by checking your blood sugar (glucose) after you have not eaten for  a while (fasting). You may have this done every 1-3 years. Abdominal aortic aneurysm (AAA) screening. You may need this if you are a current or former smoker. Osteoporosis. You may be screened starting at age 48 if you are at high risk. Talk with your health care provider about your test results, treatment options, and if necessary, the need for more tests. Vaccines  Your  health care provider may recommend certain vaccines, such as: Influenza vaccine. This is recommended every year. Tetanus, diphtheria, and acellular pertussis (Tdap, Td) vaccine. You may need a Td booster every 10 years. Zoster vaccine. You may need this after age 78. Pneumococcal 13-valent conjugate (PCV13) vaccine. One dose is recommended after age 2. Pneumococcal polysaccharide (PPSV23) vaccine. One dose is recommended after age 44. Talk to your health care provider about which screenings and vaccines you need and how often you need them. This information is not intended to replace advice given to you by your health care provider. Make sure you discuss any questions you have with your health care provider. Document Released: 01/30/2015 Document Revised: 09/23/2015 Document Reviewed: 11/04/2014 Elsevier Interactive Patient Education  2017 [place] Prevention in the Home Falls can cause injuries. They can happen to people of all ages. There are many things you can do to make your home safe and to help prevent falls. What can I do on the outside of my home? Regularly fix the edges of walkways and driveways and fix any cracks. Remove anything that might make you trip as you walk through a door, such as a raised step or threshold. Trim any bushes or trees on the path to your home. Use bright outdoor lighting. Clear any walking paths of anything that might make someone trip, such as rocks or tools. Regularly check to see if handrails are loose or broken. Make sure that both sides of any steps have handrails. Any raised decks and porches should have guardrails on the edges. Have any leaves, snow, or ice cleared regularly. Use sand or salt on walking paths during winter. Clean up any spills in your garage right away. This includes oil or grease spills. What can I do in the bathroom? Use night lights. Install grab bars by the toilet and in the tub and shower. Do not use towel bars as  grab bars. Use non-skid mats or decals in the tub or shower. If you need to sit down in the shower, use a plastic, non-slip stool. Keep the floor dry. Clean up any water that spills on the floor as soon as it happens. Remove soap buildup in the tub or shower regularly. Attach bath mats securely with double-sided non-slip rug tape. Do not have throw rugs and other things on the floor that can make you trip. What can I do in the bedroom? Use night lights. Make sure that you have a light by your bed that is easy to reach. Do not use any sheets or blankets that are too big for your bed. They should not hang down onto the floor. Have a firm chair that has side arms. You can use this for support while you get dressed. Do not have throw rugs and other things on the floor that can make you trip. What can I do in the kitchen? Clean up any spills right away. Avoid walking on wet floors. Keep items that you use a lot in easy-to-reach places. If you need to reach something above you, use a strong step stool that has a  grab bar. Keep electrical cords out of the way. Do not use floor polish or wax that makes floors slippery. If you must use wax, use non-skid floor wax. Do not have throw rugs and other things on the floor that can make you trip. What can I do with my stairs? Do not leave any items on the stairs. Make sure that there are handrails on both sides of the stairs and use them. Fix handrails that are broken or loose. Make sure that handrails are as long as the stairways. Check any carpeting to make sure that it is firmly attached to the stairs. Fix any carpet that is loose or worn. Avoid having throw rugs at the top or bottom of the stairs. If you do have throw rugs, attach them to the floor with carpet tape. Make sure that you have a light switch at the top of the stairs and the bottom of the stairs. If you do not have them, ask someone to add them for you. What else can I do to help prevent  falls? Wear shoes that: Do not have high heels. Have rubber bottoms. Are comfortable and fit you well. Are closed at the toe. Do not wear sandals. If you use a stepladder: Make sure that it is fully opened. Do not climb a closed stepladder. Make sure that both sides of the stepladder are locked into place. Ask someone to hold it for you, if possible. Clearly mark and make sure that you can see: Any grab bars or handrails. First and last steps. Where the edge of each step is. Use tools that help you move around (mobility aids) if they are needed. These include: Canes. Walkers. Scooters. Crutches. Turn on the lights when you go into a dark area. Replace any light bulbs as soon as they burn out. Set up your furniture so you have a clear path. Avoid moving your furniture around. If any of your floors are uneven, fix them. If there are any pets around you, be aware of where they are. Review your medicines with your doctor. Some medicines can make you feel dizzy. This can increase your chance of falling. Ask your doctor what other things that you can do to help prevent falls. This information is not intended to replace advice given to you by your health care provider. Make sure you discuss any questions you have with your health care provider. Document Released: 10/30/2008 Document Revised: 06/11/2015 Document Reviewed: 02/07/2014 Elsevier Interactive Patient Education  2017 [employer].

## 2022-03-03 NOTE — Progress Notes (Signed)
Subjective:   Joshua Kramer is a 66 y.o. male who presents for an Initial Medicare Annual Wellness Visit.  I connected with  Joshua Kramer on 03/03/22 by a audio enabled telemedicine application and verified that I am speaking with the correct person using two identifiers.  Patient Location: Home  Provider Location: Office/Clinic  I discussed the limitations of evaluation and management by telemedicine. The patient expressed understanding and agreed to proceed.  Review of Systems     Cardiac Risk Factors include: advanced age (>19mn, >>51women);male gender     Objective:    Today's Vitals   03/03/22 1447  Weight: 275 lb (124.7 kg)  Height: 6' 1"$  (1.854 m)   Body mass index is 36.28 kg/m.     03/03/2022    2:50 PM 06/01/2015   10:58 AM 01/06/2015    1:42 PM  Advanced Directives  Does Patient Have a Medical Advance Directive? Yes No No  Type of Advance Directive Living will;Healthcare Power of Attorney    Does patient want to make changes to medical advance directive? No - Patient declined    Copy of [place] Chart? No - copy requested    Would patient like information on creating a medical advance directive?  No - patient declined information No - patient declined information    Current Medications (verified) Outpatient Encounter Medications as of 03/03/2022  Medication Sig   HYDROcodone-acetaminophen (NORCO) 7.5-325 MG tablet Take 1 tablet by mouth every 6 (six) hours as needed.   Meloxicam 10 MG CAPS Take by mouth.   Thiamine HCl (VITAMIN B-1) 250 MG tablet Take 250 mg by mouth daily.   tiZANidine (ZANAFLEX) 4 MG tablet TAKE 1 TABLET(4 MG) BY MOUTH EVERY 8 HOURS AS NEEDED FOR MUSCLE SPASMS   vitamin E 180 MG (400 UNITS) capsule Take 400 Units by mouth daily.   [DISCONTINUED] atorvastatin (LIPITOR) 40 MG tablet Take 1 tablet (40 mg total) by mouth daily. (Patient not taking: Reported on 02/19/2021)   [DISCONTINUED] gabapentin (NEURONTIN) 300 MG  capsule Take 1 capsule (300 mg total) by mouth 3 (three) times daily. (Patient not taking: Reported on 02/19/2021)   No facility-administered encounter medications on file as of 03/03/2022.    Allergies (verified) Patient has no known allergies.   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 abnormal nuc stress test with normal cors and LV function on subsequent cath   History of tobacco abuse    Past Surgical History:  Procedure Laterality Date   CARDIAC CATHETERIZATION  2011   nl cors, nl EF   CARDIAC CATHETERIZATION N/A 06/02/2015   Procedure: Left Heart Cath and Coronary Angiography;  Surgeon: TTroy Sine MD;  [facility];  Service: Cardiovascular;  Laterality: N/A;   Family History  Problem Relation Age of Onset   Arthritis Mother    Social History   Socioeconomic History   Marital status: Married    Spouse name: Not on file   Number of children: 1   Years of education: Not on file   Highest education level: Not on file  Occupational History   Occupation: Sales  Tobacco Use   Smoking status: Former    Types: Cigarettes    Quit date: 06/18/2014    Years since quitting: 7.7   Smokeless tobacco: Never  Substance and Sexual Activity   Alcohol use: No  Alcohol/week: 0.0 standard drinks of alcohol   Drug use: No   Sexual activity: Yes  Other Topics Concern   Not on file  Social History Narrative   Previously was a marathon runner    Lives with wife; 1 daughter    Social Determinants of Health   Financial Resource Strain: Low Risk  (03/03/2022)   Overall Financial Resource Strain (CARDIA)    Difficulty of Paying Living Expenses: Not hard at all  Food Insecurity: No Food Insecurity (03/03/2022)   Hunger Vital Sign    Worried About Running Out of Food in the Last Year: Never true    Ran Out of Food in the Last Year: Never true  Transportation Needs: No  Transportation Needs (03/03/2022)   PRAPARE - Hydrologist (Medical): No    Lack of Transportation (Non-Medical): No  Physical Activity: Inactive (03/03/2022)   Exercise Vital Sign    Days of Exercise per Week: 0 days    Minutes of Exercise per Session: 0 min  Stress: No Stress Concern Present (03/03/2022)   [place]    Feeling of Stress : Not at all  Social Connections: Moderately Integrated (03/03/2022)   Social Connection and Isolation Panel [NHANES]    Frequency of Communication with Friends and Family: More than three times a week    Frequency of Social Gatherings with Friends and Family: Three times a week    Attends Religious Services: 1 to 4 times per year    Active Member of Clubs or Organizations: No    Attends Archivist Meetings: Never    Marital Status: Married    Tobacco Counseling Counseling given: Not Answered   Clinical Intake:  Pre-visit preparation completed: Yes  Pain : No/denies pain  Diabetes: No  How often do you need to have someone help you when you read instructions, pamphlets, or other written materials from your doctor or pharmacy?: 1 - Never  Diabetic?No   Interpreter Needed?: No  Information entered by :: Joshua Kramer   Activities of Daily Living    03/03/2022    2:50 PM  In your present state of health, do you have any difficulty performing the following activities:  Hearing? 0  Vision? 0  Difficulty concentrating or making decisions? 0  Walking or climbing stairs? 1  Dressing or bathing? 0  Doing errands, shopping? 1  Preparing Food and eating ? N  Using the Toilet? N  In the past six months, have you accidently leaked urine? N  Do you have problems with loss of bowel control? N  Managing your Medications? N  Managing your Finances? N  Housekeeping or managing your Housekeeping? Y    Patient Care Team: Susy Frizzle, MD as PCP - General (Family Medicine)  Indicate any recent Medical Services you may have received from other than [facility] providers in the past year (date may be approximate).     Assessment:   This is a routine wellness examination for Joshua Kramer.  Hearing/Vision screen Hearing Screening - Comments:: Denies hearing difficulties  Vision Screening - Comments:: No vision problems-plans to schedule routine eye exam   Dietary issues and exercise activities discussed: Current Exercise Habits: The patient does not participate in regular exercise at present   Goals Addressed             This Visit's Progress    Increase physical activity  Depression Screen    03/03/2022    2:49 PM 02/28/2020    2:59 PM 02/28/2020    2:58 PM 05/04/2017    8:18 AM  PHQ 2/9 Scores  PHQ - 2 Score 0 0 0 0    Fall Risk    03/03/2022    2:48 PM 02/28/2020    2:58 PM  [place] in the past year? 0   Number falls in past yr: 0 0  Injury with Fall? 0 0  Follow up Falls prevention discussed;Education provided;Falls evaluation completed     FALL RISK PREVENTION PERTAINING TO THE HOME:  Any stairs in or around the home? No  If so, are there any without handrails? No  Home free of loose throw rugs in walkways, pet beds, electrical cords, etc? Yes  Adequate lighting in your home to reduce risk of falls? Yes   ASSISTIVE DEVICES UTILIZED TO PREVENT FALLS:  Life alert? No  Use of a cane, walker or w/c? Yes  Grab bars in the bathroom? Yes  Shower chair or bench in shower? No  Elevated toilet seat or a handicapped toilet? Yes   TIMED UP AND GO:  Was the test performed? No . Telephonic visit   Cognitive Function:        03/03/2022    2:51 PM  6CIT Screen  What Year? 0 points  What month? 0 points  What time? 0 points  Count back from 20 0 points  Months in reverse 0 points  Repeat phrase 0 points  Total Score 0 points    Immunizations  There is no immunization history on  file for this patient.  TDAP status: Due, Education has been provided regarding the importance of this vaccine. Advised may receive this vaccine at local pharmacy or Health Dept. Aware to provide a copy of the vaccination record if obtained from local pharmacy or Health Dept. Verbalized acceptance and understanding.  Flu Vaccine status: Declined, Education has been provided regarding the importance of this vaccine but patient still declined. Advised may receive this vaccine at local pharmacy or Health Dept. Aware to provide a copy of the vaccination record if obtained from local pharmacy or Health Dept. Verbalized acceptance and understanding.  Pneumococcal vaccine status: Declined,  Education has been provided regarding the importance of this vaccine but patient still declined. Advised may receive this vaccine at local pharmacy or Health Dept. Aware to provide a copy of the vaccination record if obtained from local pharmacy or Health Dept. Verbalized acceptance and understanding.   Covid-19 vaccine status: Declined, Education has been provided regarding the importance of this vaccine but patient still declined. Advised may receive this vaccine at local pharmacy or Health Dept.or vaccine clinic. Aware to provide a copy of the vaccination record if obtained from local pharmacy or Health Dept. Verbalized acceptance and understanding.  Qualifies for Shingles Vaccine? Yes   Zostavax completed No   Shingrix Completed?: No.    Education has been provided regarding the importance of this vaccine. Patient has been advised to call insurance company to determine out of pocket expense if they have not yet received this vaccine. Advised may also receive vaccine at local pharmacy or Health Dept. Verbalized acceptance and understanding.  Screening Tests Health Maintenance  Topic Date Due   Hepatitis C Screening  Never done   COVID-19 Vaccine (1) 03/19/2022 (Originally 11/20/1955)   INFLUENZA VACCINE  04/17/2022  (Originally 08/17/2021)   Zoster Vaccines- Shingrix (1 of 2) 06/01/2022 (Originally  05/19/2005)   Pneumonia Vaccine 16+ Years old (1 of 1 - PCV) 03/04/2023 (Originally 05/19/2020)   COLONOSCOPY (Pts 45-75yr Insurance coverage will need to be confirmed)  03/04/2023 (Originally 05/19/2000)   Medicare Annual Wellness (AWV)  03/04/2023   HPV VACCINES  Aged Out   DTaP/Tdap/Td  Discontinued    Health Maintenance  Health Maintenance Due  Topic Date Due   Hepatitis C Screening  Never done    Colorectal cancer status:  Patient declines at this time states that he completed at home screening sent by insurance and was notified that it was negative   Lung Cancer Screening: (Low Dose CT Chest recommended if Age 67-80years, 30 pack-year currently smoking OR have quit w/in 15years.) does not qualify.   Lung Cancer Screening Referral: n/a  Additional Screening:  Hepatitis C Screening: does qualify; Completed at next office visit   Vision Screening: Recommended annual ophthalmology exams for early detection of glaucoma and other disorders of the eye. Is the patient up to date with their annual eye exam?  No  Who is the provider or what is the name of the office in which the patient attends annual eye exams? none If pt is not established with a provider, would they like to be referred to a provider to establish care? No .   Dental Screening: Recommended annual dental exams for proper oral hygiene  Community Resource Referral / Chronic Care Management: CRR required this visit?  No   CCM required this visit?  No      Plan:     I have personally reviewed and noted the following in the patient's chart:   Medical and social history Use of alcohol, tobacco or illicit drugs  Current medications and supplements including opioid prescriptions. Patient is currently taking opioid prescriptions. Information provided to patient regarding non-opioid alternatives. Patient advised to discuss non-opioid  treatment plan with their provider. Functional ability and status Nutritional status Physical activity Advanced directives List of other physicians Hospitalizations, surgeries, and ER visits in previous 12 months Vitals Screenings to include cognitive, depression, and falls Referrals and appointments  In addition, I have reviewed and discussed with patient certain preventive protocols, quality metrics, and best practice recommendations. A written personalized care plan for preventive services as well as general preventive health recommendations were provided to patient.     SVanetta Mulders [place]  [id number]  Due to this being a virtual visit, the after visit summary with patients personalized plan was offered to patient via mail or my-chart.  Patient would like to access on my-chart  Nurse Notes: No concerns; would like to know if he needs to schedule a follow up visit to continue receiving medications

## 2022-03-10 ENCOUNTER — Other Ambulatory Visit: Payer: Self-pay | Admitting: Family Medicine

## 2022-03-10 NOTE — Telephone Encounter (Signed)
Requested medication (s) are due for refill today: yes  Requested medication (s) are on the active medication list: yes  Last refill:  01/28/22  Future visit scheduled: yes  Notes to clinic:  Unable to refill per protocol, cannot delegate.      Requested Prescriptions  Pending Prescriptions Disp Refills   tiZANidine (ZANAFLEX) 4 MG tablet [Pharmacy Med Name: TIZANIDINE 4MG TABLETS] 90 tablet 0    Sig: TAKE 1 TABLET(4 MG) BY MOUTH EVERY 8 HOURS AS NEEDED FOR MUSCLE SPASMS     Not Delegated - Cardiovascular:  Alpha-2 Agonists - tizanidine Failed - 03/10/2022  3:29 AM      Failed - This refill cannot be delegated      Failed - Valid encounter within last 6 months    Recent Outpatient Visits           1 year ago Bilateral impacted cerumen   [place] Susy Frizzle, MD   1 year ago Colon cancer screening   [place] Susy Frizzle, MD   2 years ago Screening cholesterol level   [place] Pickard, Cammie Mcgee, MD   2 years ago Pure hypercholesterolemia   [place] Schaumann, Cammie Mcgee, MD   3 years ago Screening cholesterol level   [place] Pickard, Cammie Mcgee, MD

## 2022-03-14 ENCOUNTER — Other Ambulatory Visit: Payer: Self-pay

## 2022-03-14 ENCOUNTER — Telehealth: Payer: Self-pay | Admitting: Family Medicine

## 2022-03-14 DIAGNOSIS — M450 Ankylosing spondylitis of multiple sites in spine: Secondary | ICD-10-CM

## 2022-03-14 MED ORDER — TIZANIDINE HCL 4 MG PO TABS: ORAL_TABLET | ORAL | 0 refills | Status: DC

## 2022-03-14 NOTE — Telephone Encounter (Signed)
Prescription Request  03/14/2022  Is this a "Controlled Substance" medicine? Yes  LOV: Visit date not found  What is the name of the medication or equipment?   HYDROcodone-acetaminophen (NORCO) 7.5-325 MG tablet   tiZANidine (ZANAFLEX) 4 MG tablet [id number]  **PATIENT UNSURE IF PRIOR AUTH NEEDED. PHARMACIST NEEDS NEW SCRIPT FOR TIZANIDINE.   Have you contacted your pharmacy to request a refill? Yes   Which pharmacy would you like this sent to?  [facility], [address] [place] [address] Phone: [phone] Fax: [phone]    Patient notified that their request is being sent to the clinical staff for review and that they should receive a response within 2 business days.   Please advise pharmacist

## 2022-03-15 NOTE — Telephone Encounter (Signed)
Patient called to follow up on refill request for   HYDROcodone-acetaminophen (NORCO) 7.5-325 MG tablet [id number]   [facility] pharmacy refilled the other medication. Patient unsure of why hydrocodone wasn't refilled.  Please advise at [phone].

## 2022-03-17 ENCOUNTER — Other Ambulatory Visit: Payer: Self-pay | Admitting: Family Medicine

## 2022-03-17 DIAGNOSIS — M450 Ankylosing spondylitis of multiple sites in spine: Secondary | ICD-10-CM

## 2022-03-17 MED ORDER — HYDROCODONE-ACETAMINOPHEN 7.5-325 MG PO TABS: 1.0000 | ORAL_TABLET | Freq: Four times a day (QID) | ORAL | 0 refills | Status: DC | PRN

## 2022-03-31 ENCOUNTER — Ambulatory Visit (INDEPENDENT_AMBULATORY_CARE_PROVIDER_SITE_OTHER): Payer: Medicare HMO | Admitting: Family Medicine

## 2022-03-31 ENCOUNTER — Encounter: Payer: Self-pay | Admitting: Family Medicine

## 2022-03-31 VITALS — BP 136/76 | HR 58 | Temp 98.2°F | Ht 73.0 in | Wt 261.6 lb

## 2022-03-31 DIAGNOSIS — M450 Ankylosing spondylitis of multiple sites in spine: Secondary | ICD-10-CM | POA: Diagnosis not present

## 2022-03-31 DIAGNOSIS — Z5181 Encounter for therapeutic drug level monitoring: Secondary | ICD-10-CM | POA: Diagnosis not present

## 2022-03-31 NOTE — Progress Notes (Signed)
Subjective:    Patient ID: Joshua Kramer, male    DOB: 08-Aug-1955, 67 y.o.   MRN: [id number] Patient is a 67 year old Caucasian male with a history of ankylosing spondylitis.  He states that his disease has gotten unbearable.  He reports severe pain in his neck, his upper back, his lower back, both ankles, and his feet.  He states that his ankles are essentially fused due to his disease.  He is unable to bend them.  He is on his feet for more than a few minutes they swells up twice they are normal size per his report.  He is unable to pinpoint locations of pain in his feet.  He says that things hurt constantly over his entire foot.  He DOES get pain up and down back to his tailbone to his neck.  Recently he fell landing on his outstretched hands.  He did not suffer any injury to his hands, wrist, or forearms.  However he felt a pop in the center of his back roughly at the level of T10.  He had searing pain in his back ever since this time.  He states that he wants x-rays to see the progression of his disease.  He is also requesting a refill on his pain medication. Past Medical History:  Diagnosis Date   Ankylosing spondylitis of multiple sites in spine ([place])    Chest pain 05/2015   a. abnormal nuc stress test with normal cors and LV function on subsequent cath   History of tobacco abuse    Past Surgical History:  Procedure Laterality Date   CARDIAC CATHETERIZATION  2011   nl cors, nl EF   CARDIAC CATHETERIZATION N/A 06/02/2015   Procedure: Left Heart Cath and Coronary Angiography;  Surgeon: Troy Sine, MD;  Location: [place] [facility];  Service: Cardiovascular;  Laterality: N/A;   Current Outpatient Medications on File Prior to Visit  Medication Sig Dispense Refill   HYDROcodone-acetaminophen (NORCO) 7.5-325 MG tablet Take 1 tablet by mouth every 6 (six) hours as needed. 120 tablet 0   Meloxicam 10 MG CAPS Take by mouth.     Thiamine HCl (VITAMIN B-1) 250 MG tablet Take 250 mg by mouth  daily.     tiZANidine (ZANAFLEX) 4 MG tablet TAKE 1 TABLET(4 MG) BY MOUTH EVERY 8 HOURS AS NEEDED FOR MUSCLE SPASMS 90 tablet 0   vitamin E 180 MG (400 UNITS) capsule Take 400 Units by mouth daily.     No current facility-administered medications on file prior to visit.   No Known Allergies Social History   Socioeconomic History   Marital status: Married    Spouse name: Not on file   Number of children: 1   Years of education: Not on file   Highest education level: Not on file  Occupational History   Occupation: Sales  Tobacco Use   Smoking status: Former    Types: Cigarettes    Quit date: 06/18/2014    Years since quitting: 7.7   Smokeless tobacco: Never  Substance and Sexual Activity   Alcohol use: No    Alcohol/week: 0.0 standard drinks of alcohol   Drug use: No   Sexual activity: Yes  Other Topics Concern   Not on file  Social History Narrative   Previously was a marathon runner    Lives with wife; 1 daughter    Social Determinants of Health   Financial Resource Strain: Low Risk  (03/03/2022)   Overall Financial Resource Strain ([place])  Difficulty of Paying Living Expenses: Not hard at all  Food Insecurity: No Food Insecurity (03/03/2022)   Hunger Vital Sign    Worried About Running Out of Food in the Last Year: Never true    Ran Out of Food in the Last Year: Never true  Transportation Needs: No Transportation Needs (03/03/2022)   PRAPARE - Hydrologist (Medical): No    Lack of Transportation (Non-Medical): No  Physical Activity: Inactive (03/03/2022)   Exercise Vital Sign    Days of Exercise per Week: 0 days    Minutes of Exercise per Session: 0 min  Stress: No Stress Concern Present (03/03/2022)   [place]    Feeling of Stress : Not at all  Social Connections: Moderately Integrated (03/03/2022)   Social Connection and Isolation Panel [NHANES]    Frequency of  Communication with Friends and Family: More than three times a week    Frequency of Social Gatherings with Friends and Family: Three times a week    Attends Religious Services: 1 to 4 times per year    Active Member of Clubs or Organizations: No    Attends Archivist Meetings: Never    Marital Status: Married  Human resources officer Violence: Not At Risk (03/03/2022)   Humiliation, Afraid, Rape, and Kick questionnaire    Fear of Current or Ex-Partner: No    Emotionally Abused: No    Physically Abused: No    Sexually Abused: No      Review of Systems  All other systems reviewed and are negative.      Objective:   Physical Exam Vitals reviewed.  Constitutional:      General: He is not in acute distress.    Appearance: Normal appearance. He is not ill-appearing or toxic-appearing.  Cardiovascular:     Rate and Rhythm: Normal rate and regular rhythm.     Heart sounds: Normal heart sounds. No murmur heard. Pulmonary:     Effort: Pulmonary effort is normal. No respiratory distress.     Breath sounds: Normal breath sounds. No stridor. No wheezing, rhonchi or rales.  Musculoskeletal:     Cervical back: Rigidity and tenderness present. Decreased range of motion.     Thoracic back: Tenderness and bony tenderness present. Decreased range of motion.     Lumbar back: Tenderness and bony tenderness present. Decreased range of motion.     Right ankle: Swelling present. Decreased range of motion.     Left ankle: Swelling present. Decreased range of motion.     Right foot: Bony tenderness present.     Left foot: Bony tenderness present.  Neurological:     Mental Status: He is alert.    Bilateral cerumen impaction    Assessment & Plan:  Ankylosing spondylitis of multiple sites in spine [facility]) - Plan: DG Lumbar Spine Complete, DG Thoracic Spine W/Swimmers, DG Ankle Complete Left, DG Ankle Complete Right, DG Cervical Spine Complete, DG Foot Complete Left, DG Foot Complete Right, CBC  with Differential/Platelet, BASIC METABOLIC PANEL WITH GFR, DRUG MONITOR, PANEL 1, W/CONF, URINE Patient states that the pain is becoming unbearable.  I do believe it is prudent to get x-rays given his recent fall to rule out any fracture.  I will also check a urine drug screen to ensure compliance and rule out diversion pain x-rays of both feet, both ankles, cervical, thoracic, and lumbar spine.  Obtain BMP to monitor renal function as well as  CBC.

## 2022-04-01 ENCOUNTER — Other Ambulatory Visit: Payer: Self-pay | Admitting: Family Medicine

## 2022-04-01 ENCOUNTER — Ambulatory Visit
Admission: RE | Admit: 2022-04-01 | Discharge: 2022-04-01 | Disposition: A | Discharge status: Home / Self Care | Payer: Medicare HMO | Source: Ambulatory Visit | Attending: Family Medicine | Admitting: Family Medicine

## 2022-04-01 DIAGNOSIS — M450 Ankylosing spondylitis of multiple sites in spine: Secondary | ICD-10-CM

## 2022-04-01 DIAGNOSIS — Z043 Encounter for examination and observation following other accident: Secondary | ICD-10-CM | POA: Diagnosis not present

## 2022-04-01 DIAGNOSIS — M545 Low back pain, unspecified: Secondary | ICD-10-CM | POA: Diagnosis not present

## 2022-04-01 LAB — BASIC METABOLIC PANEL WITH GFR
BUN: 17 mg/dL (ref 7–25)
CO2: 29 mmol/L (ref 20–32)
Calcium: 9.8 mg/dL (ref 8.6–10.3)
Chloride: 104 mmol/L (ref 98–110)
Creat: 0.79 mg/dL (ref 0.70–1.35)
Glucose, Bld: 145 mg/dL — ABNORMAL HIGH (ref 65–99)
Potassium: 5 mmol/L (ref 3.5–5.3)
Sodium: 140 mmol/L (ref 135–146)
eGFR: 98 mL/min/{1.73_m2} (ref >=60)

## 2022-04-01 LAB — CBC WITH DIFFERENTIAL/PLATELET
Absolute Monocytes: 477 cells/uL (ref 200–950)
Basophils Absolute: 39 cells/uL (ref 0–200)
Basophils Relative: 0.5 %
Eosinophils Absolute: 92 cells/uL (ref 15–500)
Eosinophils Relative: 1.2 %
HCT: 45 % (ref 38.5–50.0)
Hemoglobin: 15.6 g/dL (ref 13.2–17.1)
Lymphs Abs: 1763 cells/uL (ref 850–3900)
MCH: 31.3 pg (ref 27.0–33.0)
MCHC: 34.7 g/dL (ref 32.0–36.0)
MCV: 90.2 fL (ref 80.0–100.0)
MPV: 10 fL (ref 7.5–12.5)
Monocytes Relative: 6.2 %
Neutro Abs: 5328 cells/uL (ref 1500–7800)
Neutrophils Relative %: 69.2 %
Platelets: 268 10*3/uL (ref 140–400)
RBC: 4.99 10*6/uL (ref 4.20–5.80)
RDW: 12.4 % (ref 11.0–15.0)
Total Lymphocyte: 22.9 %
WBC: 7.7 10*3/uL (ref 3.8–10.8)

## 2022-04-02 LAB — DRUG MONITOR, PANEL 1, W/CONF, URINE
Amphetamines: NEGATIVE ng/mL (ref <500)
Barbiturates: NEGATIVE ng/mL (ref <300)
Benzodiazepines: NEGATIVE ng/mL (ref <100)
Cocaine Metabolite: NEGATIVE ng/mL (ref <150)
Codeine: NEGATIVE ng/mL (ref <50)
Creatinine: 114 mg/dL (ref >=20.0)
Hydrocodone: 1074 ng/mL — ABNORMAL HIGH (ref <50)
Hydromorphone: 335 ng/mL — ABNORMAL HIGH (ref <50)
Marijuana Metabolite: NEGATIVE ng/mL (ref <20)
Methadone Metabolite: NEGATIVE ng/mL (ref <100)
Morphine: NEGATIVE ng/mL (ref <50)
Norhydrocodone: 732 ng/mL — ABNORMAL HIGH (ref <50)
Opiates: POSITIVE ng/mL — AB (ref <100)
Oxidant: NEGATIVE ug/mL (ref <200)
Oxycodone: NEGATIVE ng/mL (ref <100)
Phencyclidine: NEGATIVE ng/mL (ref <25)
pH: 7.2 (ref 4.5–9.0)

## 2022-04-02 LAB — DM TEMPLATE

## 2022-04-04 ENCOUNTER — Other Ambulatory Visit: Payer: Self-pay

## 2022-04-04 DIAGNOSIS — M450 Ankylosing spondylitis of multiple sites in spine: Secondary | ICD-10-CM

## 2022-04-13 ENCOUNTER — Telehealth: Payer: Self-pay | Admitting: Family Medicine

## 2022-04-13 NOTE — Telephone Encounter (Signed)
Patient states he has changed his insurance to [place]. They told him that we needed to call them at [phone] for his first refill through them that it requires a PA that will be good for one year. He runs out on the 29th and would like this taken care of before then so he isn't in any pain.                                                                                                                                           04/13/2022  LOV: 03/31/2022  Prescription Request  What is the name of the medication or equipment? HYDROcodone-acetaminophen (NORCO) 7.5-325 MG tablet    Have you contacted your pharmacy to request a refill? No   Which pharmacy would you like this sent to?  [facility], [place] AT [place] [address] Phone: [phone] Fax: [phone]    Patient notified that their request is being sent to the clinical staff for review and that they should receive a response within 2 business days.   Please advise at [facility] [phone]

## 2022-04-14 NOTE — Telephone Encounter (Signed)
Patient called to follow up on previous message. Number to reach [facility] is [phone].

## 2022-04-14 NOTE — Telephone Encounter (Signed)
Hoa Mcgilvray (KeyPearlean Brownie) PA Case ID #: [id number]  Outcome Approved today PA Case: [id number], Status: Approved, Coverage Starts on: 01/17/2022 12:00:00 AM, Coverage Ends on: 01/17/2023 12:00:00 AM. Questions? Contact [phone]. Authorization Expiration Date: 01/16/2023 Drug HYDROcodone-Acetaminophen 7.5-300MG  tablets

## 2022-04-15 NOTE — Telephone Encounter (Signed)
Patient hascalled 3 times requesting we send this medication in. He states pharmacy is stating it must have gotten lost. He said he has called his insurance since the pharmacy is also stating they have no record of the PA. I have advised patient that provider is out of the office until Monday. He state "I know I am going to have a bad weekend", and why couldn't I just send the prescription in again, I advised patient that pharmacy doesn't accept refills on this type of medication without the providers thumb print.

## 2022-04-16 ENCOUNTER — Other Ambulatory Visit: Payer: Self-pay | Admitting: Family Medicine

## 2022-04-16 DIAGNOSIS — M450 Ankylosing spondylitis of multiple sites in spine: Secondary | ICD-10-CM

## 2022-04-18 ENCOUNTER — Other Ambulatory Visit: Payer: Self-pay | Admitting: Family Medicine

## 2022-04-18 DIAGNOSIS — M450 Ankylosing spondylitis of multiple sites in spine: Secondary | ICD-10-CM

## 2022-04-18 MED ORDER — HYDROCODONE-ACETAMINOPHEN 7.5-325 MG PO TABS: 1.0000 | ORAL_TABLET | Freq: Four times a day (QID) | ORAL | 0 refills | Status: DC | PRN

## 2022-04-18 NOTE — Telephone Encounter (Signed)
Requested medication (s) are due for refill today: yes  Requested medication (s) are on the active medication list: yes  Last refill:  03/14/22  Future visit scheduled: yes  Notes to clinic:  Unable to refill per protocol, cannot delegate.      Requested Prescriptions  Pending Prescriptions Disp Refills   tiZANidine (ZANAFLEX) 4 MG tablet [Pharmacy Med Name: TIZANIDINE 4MG  TABLETS] 90 tablet 0    Sig: TAKE 1 TABLET(4 MG) BY MOUTH EVERY 8 HOURS AS NEEDED FOR MUSCLE SPASMS     Not Delegated - Cardiovascular:  Alpha-2 Agonists - tizanidine Failed - 04/16/2022  7:17 AM      Failed - This refill cannot be delegated      Failed - Valid encounter within last 6 months    Recent Outpatient Visits           1 year ago Bilateral impacted cerumen   [place] Susy Frizzle, MD   1 year ago Colon cancer screening   [place] Susy Frizzle, MD   2 years ago Screening cholesterol level   [place] Pickard, Cammie Mcgee, MD   2 years ago Pure hypercholesterolemia   [place] Dennard Schaumann, Cammie Mcgee, MD   3 years ago Screening cholesterol level   [place] Pickard, Cammie Mcgee, MD

## 2022-05-15 ENCOUNTER — Other Ambulatory Visit: Payer: Self-pay | Admitting: Family Medicine

## 2022-05-15 DIAGNOSIS — M450 Ankylosing spondylitis of multiple sites in spine: Secondary | ICD-10-CM

## 2022-05-17 ENCOUNTER — Other Ambulatory Visit: Payer: Self-pay | Admitting: Family Medicine

## 2022-05-17 ENCOUNTER — Telehealth: Payer: Self-pay

## 2022-05-17 DIAGNOSIS — M450 Ankylosing spondylitis of multiple sites in spine: Secondary | ICD-10-CM

## 2022-05-17 MED ORDER — HYDROCODONE-ACETAMINOPHEN 7.5-325 MG PO TABS: 1.0000 | ORAL_TABLET | Freq: Four times a day (QID) | ORAL | 0 refills | Status: DC | PRN

## 2022-05-17 NOTE — Telephone Encounter (Signed)
Prescription Request  05/17/2022  LOV: 03/31/22  What is the name of the medication or equipment? HYDROcodone-acetaminophen (NORCO) 7.5-325 MG tablet [[id number]]  Have you contacted your pharmacy to request a refill? No   Which pharmacy would you like this sent to?  [facility] #[id number] [place],  - [address] AT [facility] OF [facility] [address] & [facility] [address] [place] [address] Phone: [phone] Fax: [phone]    Patient notified that their request is being sent to the clinical staff for review and that they should receive a response within 2 business days.   Please advise at Mobile [phone] (mobile)

## 2022-06-13 ENCOUNTER — Other Ambulatory Visit: Payer: Self-pay | Admitting: Family Medicine

## 2022-06-13 DIAGNOSIS — M450 Ankylosing spondylitis of multiple sites in spine: Secondary | ICD-10-CM

## 2022-06-14 NOTE — Telephone Encounter (Signed)
Prescription Request  06/14/2022  LOV: 03/31/2022  What is the name of the medication or equipment? HYDROcodone-acetaminophen (NORCO) 7.5-325 MG tablet [[id number]]  Have you contacted your pharmacy to request a refill? No   Which pharmacy would you like this sent to?  [facility] #[id number] [place], [place] - [address] AT [facility] OF [facility] [address] & [facility] [address]CH [address] [place] [address] Phone: [phone] Fax: [phone]    Patient notified that their request is being sent to the clinical staff for review and that they should receive a response within 2 business days.   Please advise at [facility] [phone]

## 2022-06-16 ENCOUNTER — Telehealth: Payer: Self-pay | Admitting: Family Medicine

## 2022-06-16 NOTE — Telephone Encounter (Signed)
Pt called requesting a refill on his Hydrocodone 7.5-325. Last RF was 05/17/2022. Last OV 03/31/2022. Thanks.

## 2022-06-16 NOTE — Telephone Encounter (Signed)
Prescription Request  06/16/2022  LOV: 03/31/2022  What is the name of the medication or equipment?   HYDROcodone-acetaminophen (NORCO) 7.5-325 MG tablet   Have you contacted your pharmacy to request a refill? Yes   Which pharmacy would you like this sent to?  [facility] DRUG STORE #[id number] [place], [place] - [address] AT [facility] OF [facility] [address] & [facility] [address] [place] [address] Phone: [phone] Fax: [phone]    Patient notified that their request is being sent to the clinical staff for review and that they should receive a response within 2 business days.   Please advise patient when refill sent in at [phone].

## 2022-06-17 ENCOUNTER — Other Ambulatory Visit: Payer: Self-pay | Admitting: Family Medicine

## 2022-06-17 DIAGNOSIS — M450 Ankylosing spondylitis of multiple sites in spine: Secondary | ICD-10-CM

## 2022-06-17 MED ORDER — HYDROCODONE-ACETAMINOPHEN 7.5-325 MG PO TABS: 1.0000 | ORAL_TABLET | Freq: Four times a day (QID) | ORAL | 0 refills | Status: DC | PRN

## 2022-07-14 ENCOUNTER — Other Ambulatory Visit: Payer: Self-pay | Admitting: Family Medicine

## 2022-07-14 DIAGNOSIS — M450 Ankylosing spondylitis of multiple sites in spine: Secondary | ICD-10-CM

## 2022-07-14 NOTE — Telephone Encounter (Signed)
Pt called in to request to see if this med tiZANidine (ZANAFLEX) 4 MG tablet [[id number]] could be resent to this pharmacy please.  PHARMACY: [facility] [address]  CB#: [phone]

## 2022-07-15 NOTE — Telephone Encounter (Signed)
Prescription Request  07/15/2022  LOV: 03/31/2022  What is the name of the medication or equipment? HYDROcodone-acetaminophen (NORCO) 7.5-325 MG tablet [[id number]]  Have you contacted your pharmacy to request a refill? Yes   Which pharmacy would you like this sent to?  [facility] DR[facility] STORE #[id number] [place], [place] - [address] AT [facility] OF [facility] [address] & [facility] [address] [place] [address] Phone: [phone] Fax: [phone]    Patient notified that their request is being sent to the clinical staff for review and that they should receive a response within 2 business days.   Please advise at [facility] [phone]

## 2022-07-15 NOTE — Telephone Encounter (Signed)
Requested medication (s) are due for refill today: yes, resent to another pharmacy  Requested medication (s) are on the active medication list: yes  Last refill:  06/14/22  Future visit scheduled: no  Notes to clinic:  Unable to refill per protocol, cannot delegate. Patient request resent to [facility] pharmacy on [address]. Routing for approval.      Requested Prescriptions  Pending Prescriptions Disp Refills   tiZANidine (ZANAFLEX) 4 MG tablet [Pharmacy Med Name: TIZANIDINE 4MG  TABLETS] 90 tablet 0    Sig: TAKE 1 TABLET(4 MG) BY MOUTH EVERY 8 HOURS AS NEEDED FOR MUSCLE SPASMS     Not Delegated - Cardiovascular:  Alpha-2 Agonists - tizanidine Failed - 07/14/2022 12:21 PM      Failed - This refill cannot be delegated      Failed - Valid encounter within last 6 months    Recent Outpatient Visits           1 year ago Bilateral impacted cerumen   [facility] Donita Brooks, MD   1 year ago Colon cancer screening   [facility] Donita Brooks, MD   2 years ago Screening cholesterol level   [facility] Pickard, Priscille Heidelberg, MD   3 years ago Pure hypercholesterolemia   [facility] Tanya Nones, Priscille Heidelberg, MD   3 years ago Screening cholesterol level   [facility] Pickard, Priscille Heidelberg, MD

## 2022-07-18 ENCOUNTER — Other Ambulatory Visit: Payer: Self-pay | Admitting: Family Medicine

## 2022-07-18 DIAGNOSIS — M450 Ankylosing spondylitis of multiple sites in spine: Secondary | ICD-10-CM

## 2022-07-18 MED ORDER — TIZANIDINE HCL 4 MG PO TABS: 4.0000 mg | ORAL_TABLET | Freq: Three times a day (TID) | ORAL | 0 refills | Status: DC | PRN

## 2022-07-18 NOTE — Telephone Encounter (Signed)
Requested medication (s) are due for refill today:Yes  Requested medication (s) are on the active medication list: Yes  Last refill:  06/14/22  Future visit scheduled: Yes  Notes to clinic:  Unable to refill per protocol, cannot delegate. Send to a different pharmacy [facility]), previously request on 07/14/22 refill encounter.     Requested Prescriptions  Pending Prescriptions Disp Refills   tiZANidine (ZANAFLEX) 4 MG tablet 90 tablet 0     Not Delegated - Cardiovascular:  Alpha-2 Agonists - tizanidine Failed - 07/18/2022 10:36 AM      Failed - This refill cannot be delegated      Failed - Valid encounter within last 6 months    Recent Outpatient Visits           1 year ago Bilateral impacted cerumen   [facility] Donita Brooks, MD   1 year ago Colon cancer screening   [facility] Donita Brooks, MD   2 years ago Screening cholesterol level   [facility] Pickard, Priscille Heidelberg, MD   3 years ago Pure hypercholesterolemia   [facility] Tanya Nones, Priscille Heidelberg, MD   3 years ago Screening cholesterol level   [facility] Pickard, Priscille Heidelberg, MD

## 2022-07-18 NOTE — Telephone Encounter (Signed)
Prescription Request  07/18/2022  LOV: 03/31/2022  What is the name of the medication or equipment?   tiZANidine (ZANAFLEX) 4 MG tablet [[id number]]  **Patient prefers this pharmacy for this medication; not in stock at [facility]**  Have you contacted your pharmacy to request a refill? Yes    Which pharmacy would you like this sent to?   [facility]  [address] [place], [place] [address] Phone: [phone]   Patient notified that their request is being sent to the clinical staff for review and that they should receive a response within 2 business days.   Please advise patient when refill sent at [phone]

## 2022-07-18 NOTE — Telephone Encounter (Signed)
Patient called to follow up on refill request; last dose taken yesterday.  Requesting for provider to send in refill asap. Please see previous messages in thread.

## 2022-07-19 MED ORDER — HYDROCODONE-ACETAMINOPHEN 7.5-325 MG PO TABS: 1.0000 | ORAL_TABLET | Freq: Four times a day (QID) | ORAL | 0 refills | Status: DC | PRN

## 2022-07-19 NOTE — Addendum Note (Signed)
Addended by: Arta Silence on: 07/19/2022 11:26 AM   Modules accepted: Orders

## 2022-08-17 ENCOUNTER — Other Ambulatory Visit: Payer: Self-pay | Admitting: Family Medicine

## 2022-08-17 DIAGNOSIS — M450 Ankylosing spondylitis of multiple sites in spine: Secondary | ICD-10-CM

## 2022-08-17 NOTE — Telephone Encounter (Signed)
Prescription Request  08/17/2022  LOV: 03/31/2022  What is the name of the medication or equipment?   HYDROcodone-acetaminophen (NORCO) 7.5-325 MG tablet - [facility] to [facility] (see below)  tiZANidine (ZANAFLEX) 4 MG tablet [[id number]]  - please send to [facility] (see below)  Have you contacted your pharmacy to request a refill? Yes   Which pharmacy would you like this sent to?   [facility] DR[facility]ORE #[id number] [place], [place] - [address] AT [facility] OF [facility] [address] & [facility] [address] [place] [address] Phone: [phone] Fax: [phone]    [facility] [address], [place] - [address] [place] [place] [address] Phone: [phone]  Fax: [phone]   Patient notified that their request is being sent to the clinical staff for review and that they should receive a response within 2 business days.   Please advise patient at [phone].

## 2022-08-18 MED ORDER — TIZANIDINE HCL 4 MG PO TABS: 4.0000 mg | ORAL_TABLET | Freq: Three times a day (TID) | ORAL | 0 refills | Status: DC | PRN

## 2022-08-18 MED ORDER — HYDROCODONE-ACETAMINOPHEN 7.5-325 MG PO TABS: 1.0000 | ORAL_TABLET | Freq: Four times a day (QID) | ORAL | 0 refills | Status: DC | PRN

## 2022-09-14 ENCOUNTER — Telehealth: Payer: Self-pay | Admitting: Family Medicine

## 2022-09-14 NOTE — Telephone Encounter (Signed)
Prescription Request  09/14/2022  LOV: 03/31/2022  What is the name of the medication or equipment?   HYDROcodone-acetaminophen (NORCO) 7.5-325 MG tablet   tiZANidine (ZANAFLEX) 4 MG tablet [[id number]]  **Patient will run out of meds over the weekend**  Have you contacted your pharmacy to request a refill? Yes   Which pharmacy would you like this sent to?  [facility] [address] [place] - [address] [place] [address] Phone: [phone] Fax: [phone]    Patient notified that their request is being sent to the clinical staff for review and that they should receive a response within 2 business days.   Please advise patient at [phone].

## 2022-09-15 ENCOUNTER — Other Ambulatory Visit: Payer: Self-pay | Admitting: Family Medicine

## 2022-09-15 DIAGNOSIS — M450 Ankylosing spondylitis of multiple sites in spine: Secondary | ICD-10-CM

## 2022-09-15 MED ORDER — HYDROCODONE-ACETAMINOPHEN 7.5-325 MG PO TABS: 1.0000 | ORAL_TABLET | Freq: Four times a day (QID) | ORAL | 0 refills | Status: DC | PRN

## 2022-09-15 MED ORDER — TIZANIDINE HCL 4 MG PO TABS: 4.0000 mg | ORAL_TABLET | Freq: Three times a day (TID) | ORAL | 0 refills | Status: DC | PRN

## 2022-10-13 ENCOUNTER — Telehealth: Payer: Self-pay | Admitting: Family Medicine

## 2022-10-13 ENCOUNTER — Other Ambulatory Visit: Payer: Self-pay | Admitting: Family Medicine

## 2022-10-13 DIAGNOSIS — M450 Ankylosing spondylitis of multiple sites in spine: Secondary | ICD-10-CM

## 2022-10-13 MED ORDER — HYDROCODONE-ACETAMINOPHEN 7.5-325 MG PO TABS: 1.0000 | ORAL_TABLET | Freq: Four times a day (QID) | ORAL | 0 refills | Status: DC | PRN

## 2022-10-13 MED ORDER — TIZANIDINE HCL 4 MG PO TABS: 4.0000 mg | ORAL_TABLET | Freq: Three times a day (TID) | ORAL | 0 refills | Status: DC | PRN

## 2022-10-13 NOTE — Telephone Encounter (Signed)
Prescription Request  10/13/2022  LOV: 03/31/2022  What is the name of the medication or equipment?   HYDROcodone-acetaminophen (NORCO) 7.5-325 MG tablet   tiZANidine (ZANAFLEX) 4 MG tablet [[id number]]  **Will run out of pills Sunday, 10/16/22**  Have you contacted your pharmacy to request a refill? Yes   Which pharmacy would you like this sent to?  [facility] [address] [place] - [address] [place] [address] Phone: [phone] Fax: [phone]    Patient notified that their request is being sent to the clinical staff for review and that they should receive a response within 2 business days.   Please advise patient at [phone]

## 2022-11-11 ENCOUNTER — Other Ambulatory Visit: Payer: Self-pay

## 2022-11-11 DIAGNOSIS — M450 Ankylosing spondylitis of multiple sites in spine: Secondary | ICD-10-CM

## 2022-11-11 MED ORDER — HYDROCODONE-ACETAMINOPHEN 7.5-325 MG PO TABS: 1.0000 | ORAL_TABLET | Freq: Four times a day (QID) | ORAL | 0 refills | Status: DC | PRN

## 2022-11-11 NOTE — Telephone Encounter (Signed)
Pt called in to request a refill of this med HYDROcodone-acetaminophen (NORCO) 7.5-325 MG tablet [[id number]].  Pt is having some car trouble and would like for this prescription to be sent in to this pharmacy for this refill please   LOV: 03/31/22  PHARMACY: [facility] ON [facility] [address]  CB#: [phone]

## 2022-11-21 ENCOUNTER — Other Ambulatory Visit: Payer: Self-pay | Admitting: Family Medicine

## 2022-11-21 DIAGNOSIS — M450 Ankylosing spondylitis of multiple sites in spine: Secondary | ICD-10-CM

## 2022-11-21 NOTE — Telephone Encounter (Signed)
Pt called in to check on status of this refill, and to also ask that it be sent to original pharmacy please:  PHARMACY: [facility] [address], [place] - [address] [place] [address] Phone: [phone]  Fax: [phone]  CB: [phone]

## 2022-12-12 ENCOUNTER — Other Ambulatory Visit: Payer: Self-pay | Admitting: Family Medicine

## 2022-12-12 DIAGNOSIS — M450 Ankylosing spondylitis of multiple sites in spine: Secondary | ICD-10-CM

## 2022-12-12 NOTE — Telephone Encounter (Signed)
Copied from CRM [id number]. Topic: Clinical - Medication Refill >> Dec 12, 2022  8:15 AM Desma Mcgregor wrote: Most Recent Primary Care Visit:  Provider: Lynnea Ferrier T  [facility]  Visit Type: OFFICE VISIT  Date: 03/31/2022  Medication: HYDROcodone-acetaminophen (NORCO) 7.5-325 MG tablet and tiZANidine (ZANAFLEX) 4 MG tablet  Has the patient contacted their pharmacy? Yes (Agent: If no, request that the patient contact the pharmacy for the refill. If patient does not wish to contact the pharmacy document the reason why and proceed with request.) (Agent: If yes, when and what did the pharmacy advise?) Because it is a controlled substance  Is this the correct pharmacy for this prescription? Yes If no, delete pharmacy and type the correct one.  This is the patient's preferred pharmacy:  [facility]ORE #[id number] [place], [place] - [address] AT [facility] OF [facility] [address] & [facility] [address] [place] [address] Phone: [phone] Fax: [phone]  Has the prescription been filled recently? Yes  Is the patient out of the medication? No, but will run out before the holiday  Has the patient been seen for an appointment in the last year OR does the patient have an upcoming appointment? Yes  Can we respond through MyChart? No  Agent: Please be advised that Rx refills may take up to 3 business days. We ask that you follow-up with your pharmacy.

## 2022-12-13 ENCOUNTER — Telehealth: Payer: Self-pay

## 2022-12-13 NOTE — Telephone Encounter (Signed)
Copied from CRM [id number]. Topic: Clinical - Medication Refill >> Dec 13, 2022  1:17 PM Herbert Seta B wrote: Most Recent Primary Care Visit:  Provider: Lynnea Ferrier T  Department: [facility]  Visit Type: OFFICE VISIT  Date: 03/31/2022  Medication:  HYDROcodone-acetaminophen (NORCO) 7.5-325 MG tablet  Has the patient contacted their pharmacy? Yes-did not receive (Agent: If no, request that the patient contact the pharmacy for the refill. If patient does not wish to contact the pharmacy document the reason why and proceed with request.) (Agent: If yes, when and what did the pharmacy advise?)  Is this the correct pharmacy for this prescription? yes If no, delete pharmacy and type the correct one.  This is the patient's preferred pharmacy:  [facility] #[id number] [place], [place] - [address] AT [facility] OF [facility] [address] & [facility] [address] [place] [address] Phone: [phone] Fax: [phone]   Has the prescription been filled recently? yes  Is the patient out of the medication? yes  Has the patient been seen for an appointment in the last year OR does the patient have an upcoming appointment? yes  Can we respond through MyChart? yes  Agent: Please be advised that Rx refills may take up to 3 business days. We ask that you follow-up with your pharmacy.

## 2022-12-14 ENCOUNTER — Other Ambulatory Visit: Payer: Self-pay | Admitting: Family Medicine

## 2022-12-14 DIAGNOSIS — M450 Ankylosing spondylitis of multiple sites in spine: Secondary | ICD-10-CM

## 2022-12-14 DIAGNOSIS — R6889 Other general symptoms and signs: Secondary | ICD-10-CM | POA: Diagnosis not present

## 2022-12-14 MED ORDER — HYDROCODONE-ACETAMINOPHEN 7.5-325 MG PO TABS: 1.0000 | ORAL_TABLET | Freq: Four times a day (QID) | ORAL | 0 refills | Status: DC | PRN

## 2022-12-22 ENCOUNTER — Other Ambulatory Visit: Payer: Self-pay | Admitting: Family Medicine

## 2022-12-22 ENCOUNTER — Telehealth: Payer: Self-pay

## 2022-12-22 DIAGNOSIS — M450 Ankylosing spondylitis of multiple sites in spine: Secondary | ICD-10-CM

## 2022-12-22 MED ORDER — TIZANIDINE HCL 4 MG PO TABS: 4.0000 mg | ORAL_TABLET | Freq: Three times a day (TID) | ORAL | 0 refills | Status: DC | PRN

## 2022-12-22 NOTE — Telephone Encounter (Signed)
Copied from CRM [id number]. Topic: Clinical - Medication Refill >> Dec 22, 2022  3:19 PM Dondra Prader A wrote: Most Recent Primary Care Visit:  Provider: Lynnea Ferrier T  Department: [facility]  Visit Type: OFFICE VISIT  Date: 03/31/2022  Medication: tiZANidine (ZANAFLEX) 4 MG tablet  Has the patient contacted their pharmacy? No - per Pt prescription needs approval (Agent: If no, request that the patient contact the pharmacy for the refill. If patient does not wish to contact the pharmacy document the reason why and proceed with request.) (Agent: If yes, when and what did the pharmacy advise?)  Is this the correct pharmacy for this prescription? No [facility] - [address] [place], [place] [address] P: [phone]   If no, delete pharmacy and type the correct one.  This is the patient's preferred pharmacy:  [id number] [place], [place] - [address] AT [facility] OF [facility] [address] & [facility] [address] [place] [address] Phone: [phone] Fax: [phone]   Has the prescription been filled recently? No  Is the patient out of the medication? Yes  Has the patient been seen for an appointment in the last year OR does the patient have an upcoming appointment? Yes  Can we respond through MyChart? No  Agent: Please be advised that Rx refills may take up to 3 business days. We ask that you follow-up with your pharmacy.

## 2023-01-09 ENCOUNTER — Telehealth: Payer: Self-pay

## 2023-01-09 NOTE — Telephone Encounter (Signed)
Copied from CRM [id number]. Topic: Clinical - Prescription Issue >> Jan 09, 2023  1:40 PM Alvino Blood C wrote: Reason for CRM: PT is wanting to confirm if Prior Berkley Harvey has come from insurance [facility]) for his HYDROcodone-acetaminophen (NORCO) 7.5-325 MG tablet.

## 2023-01-12 ENCOUNTER — Other Ambulatory Visit: Payer: Self-pay | Admitting: Family Medicine

## 2023-01-12 DIAGNOSIS — M450 Ankylosing spondylitis of multiple sites in spine: Secondary | ICD-10-CM

## 2023-01-12 MED ORDER — HYDROCODONE-ACETAMINOPHEN 7.5-325 MG PO TABS: 1.0000 | ORAL_TABLET | Freq: Four times a day (QID) | ORAL | 0 refills | Status: DC | PRN

## 2023-01-12 NOTE — Telephone Encounter (Signed)
Copied from CRM [id number]. Topic: Clinical - Medication Refill >> Jan 12, 2023 11:53 AM Desma Mcgregor wrote: Most Recent Primary Care Visit:  Provider: Lynnea Ferrier T  Department: [facility]  Visit Type: OFFICE VISIT  Date: 03/31/2022  Medication: HYDROcodone-acetaminophen (NORCO) 7.5-325 MG tablet Also for tiZANidine (ZANAFLEX) 4 MG tablet. Has a few days left of this med  Has the patient contacted their pharmacy? No (Agent: If no, request that the patient contact the pharmacy for the refill. If patient does not wish to contact the pharmacy document the reason why and proceed with request.) (Agent: If yes, when and what did the pharmacy advise?)   Is this the correct pharmacy for this prescription? Yes If no, delete pharmacy and type the correct one.  This is the patient's preferred pharmacy:  [facility] #[id number] [place], [place] - [address] AT [facility] OF [facility] [address] & [facility] [address] [place] [address] Phone: [phone] Fax: [phone]   Has the prescription been filled recently? Yes  Is the patient out of the medication? No, but will be taking last tab tomorrow  Has the patient been seen for an appointment in the last year OR does the patient have an upcoming appointment? Yes  Can we respond through MyChart? Yes  Agent: Please be advised that Rx refills may take up to 3 business days. We ask that you follow-up with your pharmacy.

## 2023-01-19 ENCOUNTER — Other Ambulatory Visit: Payer: Self-pay | Admitting: Family Medicine

## 2023-01-19 DIAGNOSIS — M450 Ankylosing spondylitis of multiple sites in spine: Secondary | ICD-10-CM

## 2023-01-24 ENCOUNTER — Telehealth: Payer: Self-pay | Admitting: Family Medicine

## 2023-01-24 NOTE — Telephone Encounter (Signed)
 Patient stated that although not previously required, his insurance company [facility]) told him that he would need a PA for refills of HYDROcodone -acetaminophen  (NORCO) 7.5-325 MG tablet starting in 2025. Patient just making nurse aware.  Please advise patient at [phone].

## 2023-02-08 ENCOUNTER — Telehealth: Payer: Self-pay | Admitting: Family Medicine

## 2023-02-08 NOTE — Telephone Encounter (Signed)
Copied from CRM [id number]. Topic: Referral - Status >> Feb 08, 2023  3:36 PM Fuller Mandril wrote: Reason for CRM: Patient called asked about referral for Rheumatology. Advised referral closed sue to multiple attempts to reach patient. Patients states he was sick and unable to make appt but would like to be able to set that up now. Thank You

## 2023-02-09 ENCOUNTER — Other Ambulatory Visit: Payer: Self-pay

## 2023-02-09 DIAGNOSIS — M450 Ankylosing spondylitis of multiple sites in spine: Secondary | ICD-10-CM

## 2023-02-10 ENCOUNTER — Other Ambulatory Visit: Payer: Self-pay | Admitting: Family Medicine

## 2023-02-10 ENCOUNTER — Telehealth: Payer: Self-pay

## 2023-02-10 DIAGNOSIS — M450 Ankylosing spondylitis of multiple sites in spine: Secondary | ICD-10-CM

## 2023-02-10 MED ORDER — HYDROCODONE-ACETAMINOPHEN 7.5-325 MG PO TABS: 1.0000 | ORAL_TABLET | Freq: Four times a day (QID) | ORAL | 0 refills | Status: DC | PRN

## 2023-02-10 NOTE — Telephone Encounter (Signed)
Copied from CRM [id number]. Topic: Referral - Request for Referral >> Feb 10, 2023 11:16 AM Jorje Guild R wrote: Did the patient discuss referral with their provider in the last year? Yes Patient said provider was referred as well  Appointment offered? Yes  Type of order/referral and detailed reason for visit: Rheumatologists   Preference of office, provider, location:   If referral order, have you been seen by this specialty before? Yes Patient has had a referral before but never used. Wanting to know can be updated and sent to him to be used asap.  Can we respond through MyChart? Yes

## 2023-02-10 NOTE — Telephone Encounter (Signed)
Copied from CRM [id number]. Topic: Clinical - Medication Refill >> Feb 10, 2023 10:53 AM Gildardo Pounds wrote: Most Recent Primary Care Visit:  Provider: Lynnea Ferrier T  Department: [facility]  Visit Type: OFFICE VISIT  Date: 03/31/2022  Medication: HYDROcodone-acetaminophen (NORCO) 7.5-325 MG tablet  Has the patient contacted their pharmacy? Yes (Agent: If no, request that the patient contact the pharmacy for the refill. If patient does not wish to contact the pharmacy document the reason why and proceed with request.) (Agent: If yes, when and what did the pharmacy advise?)  Is this the correct pharmacy for this prescription? Yes If no, delete pharmacy and type the correct one.  This is the patient's preferred pharmacy:  [facility] #[id number] [place], [place] - [address] AT [facility] OF [facility] [address] & [facility] [address] [place] [address] Phone: [phone] Fax: [phone]   Has the prescription been filled recently? No  Is the patient out of the medication? No  Has the patient been seen for an appointment in the last year OR does the patient have an upcoming appointment? Yes  Can we respond through MyChart? Yes  Agent: Please be advised that Rx refills may take up to 3 business days. We ask that you follow-up with your pharmacy.

## 2023-02-17 ENCOUNTER — Other Ambulatory Visit: Payer: Self-pay | Admitting: Family Medicine

## 2023-02-17 DIAGNOSIS — M450 Ankylosing spondylitis of multiple sites in spine: Secondary | ICD-10-CM

## 2023-03-09 ENCOUNTER — Telehealth: Payer: Self-pay

## 2023-03-09 ENCOUNTER — Ambulatory Visit (INDEPENDENT_AMBULATORY_CARE_PROVIDER_SITE_OTHER): Payer: Medicare HMO

## 2023-03-09 VITALS — Ht 73.0 in | Wt 225.0 lb

## 2023-03-09 DIAGNOSIS — Z599 Problem related to housing and economic circumstances, unspecified: Secondary | ICD-10-CM | POA: Diagnosis not present

## 2023-03-09 DIAGNOSIS — Z5941 Food insecurity: Secondary | ICD-10-CM

## 2023-03-09 DIAGNOSIS — Z0001 Encounter for general adult medical examination with abnormal findings: Secondary | ICD-10-CM | POA: Diagnosis not present

## 2023-03-09 DIAGNOSIS — Z0100 Encounter for examination of eyes and vision without abnormal findings: Secondary | ICD-10-CM

## 2023-03-09 DIAGNOSIS — Z1212 Encounter for screening for malignant neoplasm of rectum: Secondary | ICD-10-CM

## 2023-03-09 DIAGNOSIS — Z1159 Encounter for screening for other viral diseases: Secondary | ICD-10-CM

## 2023-03-09 DIAGNOSIS — R195 Other fecal abnormalities: Secondary | ICD-10-CM | POA: Diagnosis not present

## 2023-03-09 DIAGNOSIS — Z5982 Transportation insecurity: Secondary | ICD-10-CM

## 2023-03-09 DIAGNOSIS — Z0000 Encounter for general adult medical examination without abnormal findings: Secondary | ICD-10-CM

## 2023-03-09 NOTE — Progress Notes (Signed)
 Subjective:   Joshua Kramer is a 68 y.o. who presents for a Medicare Wellness preventive visit.  Visit Complete: Virtual I connected with  Joshua Kramer on 03/09/23 by a audio enabled telemedicine application and verified that I am speaking with the correct person using two identifiers.  Patient Location: Home  Provider Location: Home Office  I discussed the limitations of evaluation and management by telemedicine. The patient expressed understanding and agreed to proceed.  Vital Signs: Because this visit was a virtual/telehealth visit, some criteria may be missing or patient reported. Any vitals not documented were not able to be obtained and vitals that have been documented are patient reported.  VideoDeclined- This patient declined Librarian, academic. Therefore the visit was completed with audio only.  AWV Questionnaire: No: Patient Medicare AWV questionnaire was not completed prior to this visit.  Cardiac Risk Factors include: advanced age (>78men, >32 women)     Objective:    Today's Vitals   03/09/23 1426  Weight: 225 lb (102.1 kg)  Height: 6\' 1"  (1.854 m)  PainSc: 5    Body mass index is 29.69 kg/m.     03/09/2023    2:30 PM 03/03/2022    2:50 PM 06/01/2015   10:58 AM 01/06/2015    1:42 PM  Advanced Directives  Does Patient Have a Medical Advance Directive? No Yes No No  Type of Advance Directive  Living will;Healthcare Power of Attorney    Does patient want to make changes to medical advance directive?  No - Patient declined    Copy of Healthcare Power of Attorney in Chart?  No - copy requested    Would patient like information on creating a medical advance directive? No - Patient declined  No - patient declined information No - patient declined information    Current Medications (verified) Outpatient Encounter Medications as of 03/09/2023  Medication Sig   HYDROcodone-acetaminophen (NORCO) 7.5-325 MG tablet Take 1 tablet by mouth  every 6 (six) hours as needed.   Meloxicam 10 MG CAPS Take by mouth.   Thiamine HCl (VITAMIN B-1) 250 MG tablet Take 250 mg by mouth daily.   tiZANidine (ZANAFLEX) 4 MG tablet TAKE 1 TABLET(4 MG) BY MOUTH EVERY 8 HOURS AS NEEDED FOR MUSCLE SPASMS   vitamin E 180 MG (400 UNITS) capsule Take 400 Units by mouth daily.   No facility-administered encounter medications on file as of 03/09/2023.    Allergies (verified) Patient has no known allergies.   History: Past Medical History:  Diagnosis Date   Ankylosing spondylitis of multiple sites in spine (HCC)    Chest pain 05/2015   a. abnormal nuc stress test with normal cors and LV function on subsequent cath   History of tobacco abuse    Past Surgical History:  Procedure Laterality Date   CARDIAC CATHETERIZATION  2011   nl cors, nl EF   CARDIAC CATHETERIZATION N/A 06/02/2015   Procedure: Left Heart Cath and Coronary Angiography;  Surgeon: Lennette Bihari, MD;  Location: [facility];  Service: Cardiovascular;  Laterality: N/A;   Family History  Problem Relation Age of Onset   Arthritis Mother    Social History   Socioeconomic History   Marital status: Married    Spouse name: Not on file   Number of children: 1   Years of education: Not on file   Highest education level: Not on file  Occupational History   Occupation: Sales  Tobacco Use   Smoking status: Former  Current packs/day: 0.00    Types: Cigarettes    Quit date: 06/18/2014    Years since quitting: 8.7   Smokeless tobacco: Never  Substance and Sexual Activity   Alcohol use: No    Alcohol/week: 0.0 standard drinks of alcohol   Drug use: No   Sexual activity: Yes  Other Topics Concern   Not on file  Social History Narrative   Previously was a marathon runner    Lives with wife; 1 daughter    Social Drivers of Corporate investment banker Strain: High Risk (03/09/2023)   Overall Financial Resource Strain (CARDIA)    Difficulty of Paying Living Expenses: Hard   Food Insecurity: No Food Insecurity (03/09/2023)   Hunger Vital Sign    Worried About Running Out of Food in the Last Year: Never true    Ran Out of Food in the Last Year: Never true  Transportation Needs: Unmet Transportation Needs (03/09/2023)   PRAPARE - Transportation    Lack of Transportation (Medical): Yes    Lack of Transportation (Non-Medical): Yes  Physical Activity: Inactive (03/09/2023)   Exercise Vital Sign    Days of Exercise per Week: 0 days    Minutes of Exercise per Session: 0 min  Stress: No Stress Concern Present (03/09/2023)   [employer] of Occupational Health - Occupational Stress Questionnaire    Feeling of Stress : Only a little  Social Connections: Moderately Integrated (03/09/2023)   Social Connection and Isolation Panel [NHANES]    Frequency of Communication with Friends and Family: More than three times a week    Frequency of Social Gatherings with Friends and Family: More than three times a week    Attends Religious Services: 1 to 4 times per year    Active Member of [employer] or Organizations: No    Attends Engineer, structural: Never    Marital Status: Married    Tobacco Counseling Counseling given: Yes    Clinical Intake:  Pre-visit preparation completed: Yes  Pain : 0-10 Pain Score: 5  Pain Type: Chronic pain Pain Location:  (all over especially in joints) Pain Descriptors / Indicators: Constant Pain Onset: More than a month ago Pain Frequency: Constant Effect of Pain on Daily Activities: unable to do certain things due to several fusions     BMI - recorded: 29.69 Nutritional Status: BMI 25 -29 Overweight Nutritional Risks: None Diabetes: No  How often do you need to have someone help you when you read instructions, pamphlets, or other written materials from your doctor or pharmacy?: 1 - Never  Interpreter Needed?: No  Information entered by :: Maryjean Ka CMA   Activities of Daily Living     03/09/2023    2:27 PM  In your  present state of health, do you have any difficulty performing the following activities:  Hearing? 0  Vision? 0  Difficulty concentrating or making decisions? 0  Walking or climbing stairs? 1  Comment patient has ankylosing spondylitis  Dressing or bathing? 1  Comment has difficulty but is able to do most of it alone.  Doing errands, shopping? 0  Preparing Food and eating ? N  Using the Toilet? N  In the past six months, have you accidently leaked urine? N  Do you have problems with loss of bowel control? N  Managing your Medications? N  Managing your Finances? N  Housekeeping or managing your Housekeeping? Y  Comment wife helps him    Patient Care Team: Donita Brooks, MD  as PCP - General (Family Medicine)  Indicate any recent Medical Services you may have received from other than [facility] providers in the past year (date may be approximate).     Assessment:   This is a routine wellness examination for Srijan.  Hearing/Vision screen Hearing Screening - Comments:: Patient denies any hearing difficulties.   Vision Screening - Comments:: No eye doctor.Referral placed for patient to establish with a provider   Goals Addressed             This Visit's Progress    Patient Stated       To get into a better place financially and improve my health       Depression Screen     03/09/2023    2:31 PM 03/03/2022    2:49 PM 02/28/2020    2:59 PM 02/28/2020    2:58 PM 05/04/2017    8:18 AM  PHQ 2/9 Scores  PHQ - 2 Score 0 0 0 0 0  PHQ- 9 Score 0        Fall Risk     03/09/2023    2:08 PM 03/31/2022   12:10 PM 03/03/2022    2:48 PM 02/28/2020    2:58 PM  Fall Risk   Falls in the past year? 1 1 0   Number falls in past yr: 1 1 0 0  Injury with Fall? 0 1 0 0  Risk for fall due to : History of fall(s);Impaired balance/gait;Orthopedic patient;Impaired mobility History of fall(s);Impaired balance/gait;Orthopedic patient    Follow up Falls prevention discussed;Education  provided Education provided;Falls prevention discussed Falls prevention discussed;Education provided;Falls evaluation completed     MEDICARE RISK AT HOME:  Medicare Risk at Home Any stairs in or around the home?: No If so, are there any without handrails?: No Home free of loose throw rugs in walkways, pet beds, electrical cords, etc?: Yes Adequate lighting in your home to reduce risk of falls?: Yes Life alert?: No Use of a cane, walker or w/c?: Yes Grab bars in the bathroom?: No Shower chair or bench in shower?: Yes Elevated toilet seat or a handicapped toilet?: No  TIMED UP AND GO:  Was the test performed?  No  Cognitive Function: 6CIT completed        03/09/2023    2:19 PM 03/03/2022    2:51 PM  6CIT Screen  What Year? 0 points 0 points  What month? 0 points 0 points  What time? 0 points 0 points  Count back from 20 0 points 0 points  Months in reverse 0 points 0 points  Repeat phrase 0 points 0 points  Total Score 0 points 0 points    Immunizations  There is no immunization history on file for this patient.  Screening Tests Health Maintenance  Topic Date Due   Pneumonia Vaccine 24+ Years old (1 of 2 - PCV) Never done   Hepatitis C Screening  Never done   Colonoscopy  Never done   Zoster Vaccines- Shingrix (1 of 2) Never done   INFLUENZA VACCINE  Never done   COVID-19 Vaccine (1 - 2024-25 season) Never done   Medicare Annual Wellness (AWV)  03/08/2024   HPV VACCINES  Aged Out   DTaP/Tdap/Td  Discontinued    Health Maintenance  Health Maintenance Due  Topic Date Due   Pneumonia Vaccine 2+ Years old (1 of 2 - PCV) Never done   Hepatitis C Screening  Never done   Colonoscopy  Never done  Zoster Vaccines- Shingrix (1 of 2) Never done   INFLUENZA VACCINE  Never done   COVID-19 Vaccine (1 - 2024-25 season) Never done   Health Maintenance Items Addressed: Referral sent to GI for colonoscopy, Referral sent to Optometry/Ophthalmology, Hepatitis C Screening,  Discussed positive cologuard in 2023 and need for further evaluation. Patient verbalized understanding and agreed to referral.   Additional Screening:  Vision Screening: Recommended annual ophthalmology exams for early detection of glaucoma and other disorders of the eye.  Dental Screening: Recommended annual dental exams for proper oral hygiene  Community Resource Referral / Chronic Care Management: CRR required this visit?  Yes   CCM required this visit?  No     Plan:     I have personally reviewed and noted the following in the patient's chart:   Medical and social history Use of alcohol, tobacco or illicit drugs  Current medications and supplements including opioid prescriptions. Patient is currently taking opioid prescriptions. Information provided to patient regarding non-opioid alternatives. Patient advised to discuss non-opioid treatment plan with their provider. Functional ability and status Nutritional status Physical activity Advanced directives List of other physicians Hospitalizations, surgeries, and ER visits in previous 12 months Vitals Screenings to include cognitive, depression, and falls Referrals and appointments  In addition, I have reviewed and discussed with patient certain preventive protocols, quality metrics, and best practice recommendations. A written personalized care plan for preventive services as well as general preventive health recommendations were provided to patient.     Jordan Hawks Annelisa Ryback, CMA   03/09/2023   After Visit Summary: (MyChart) Due to this being a telephonic visit, the after visit summary with patients personalized plan was offered to patient via MyChart   Notes: Please refer to Routing Comments.

## 2023-03-09 NOTE — Telephone Encounter (Signed)
 Patient stated that the rheumatology referral, that office didn't have any appointments available until June. Is wanting to be referred to another provider that can see him sooner. Has several swelling in ankles and feet (per patient)

## 2023-03-09 NOTE — Patient Instructions (Addendum)
 Mr. Joshua Kramer , Thank you for taking time to come for your Medicare Wellness Visit. I appreciate your ongoing commitment to your health goals. Please review the following plan we discussed and let me know if I can assist you in the future.   Referrals/Orders/Follow-Ups/Clinician Recommendations:   Next Medicare Annual Wellness Visit:  March 14, 2024 at 9:40am video visit  [facility] [address] [place] [place],  [place]  [address] Main: [phone]  Elvera Bicker been referred to see Dr. Sinda Du for an eye exam. If you haven't heard from his office in about a week, please call to schedule your appointment.   Sinda Du, MD [address] [place] [address]  Phone: [phone]  A referral has been placed for you to see if there are any additional resources to help you with one of the following:     [x]   Transportation Needs   []   Utility Needs   []   Food Insecurity   []  Assistance with daily activities such as bath, dressing, and managing your medications   []  Housing Insecurity   []  Assistance with your medications  If you haven't heard from anyone within the next 7 business days, please call them and let them know a referral has been placed  Concierge Line: [phone]  If lab work has been ordered for you today, you may have these drawn at the same lab you have your routine lab work drawn for your primary care provider.  Labs Ordered: Hep C  You've been referred to see Dr. Sinda Du for an eye exam. If you haven't heard from his office in about a week, please call to schedule your appointment.   Sinda Du, MD [address] [place] [address]  Phone: [phone]   This is a list of the screening recommended for you and due dates:  Health Maintenance  Topic Date Due   Pneumonia Vaccine (1 of 2 - PCV) Never done   Hepatitis C Screening  Never done   Colon Cancer Screening  Never done   Zoster (Shingles) Vaccine (1 of 2) Never  done   Flu Shot  Never done   COVID-19 Vaccine (1 - 2024-25 season) Never done   Medicare Annual Wellness Visit  03/08/2024   HPV Vaccine  Aged Out   DTaP/Tdap/Td vaccine  Discontinued    Advanced directives: (Declined) Advance directive discussed with you today. Even though you declined this today, please call our office should you change your mind, and we can give you the proper paperwork for you to fill out.  Next Medicare Annual Wellness Visit scheduled for next year: yes  Understanding Your Risk for Falls Millions of people have serious injuries from falls each year. It is important to understand your risk of falling. Talk with your health care provider about your risk and what you can do to lower it. If you do have a serious fall, make sure to tell your provider. Falling once raises your risk of falling again. How can falls affect me? Serious injuries from falls are common. These include: Broken bones, such as hip fractures. Head injuries, such as traumatic brain injuries (TBI) or concussions. A fear of falling can cause you to avoid activities and stay at home. This can make your muscles weaker and raise your risk for a fall. What can increase my risk? There are a number of risk factors that increase your risk for falling. The more risk factors you have,  the higher your risk of falling. Serious injuries from a fall happen most often to people who are older than 68 years old. Teenagers and young adults ages 73-29 are also at higher risk. Common risk factors include: Weakness in the lower body. Being generally weak or confused due to long-term (chronic) illness. Dizziness or balance problems. Poor vision. Medicines that cause dizziness or drowsiness. These may include: Medicines for your blood pressure, heart, anxiety, insomnia, or swelling (edema). Pain medicines. Muscle relaxants. Other risk factors include: Drinking alcohol. Having had a fall in the past. Having foot pain or  wearing improper footwear. Working at a dangerous job. Having any of the following in your home: Tripping hazards, such as floor clutter or loose rugs. Poor lighting. Pets. Having dementia or memory loss. What actions can I take to lower my risk of falling?     Physical activity Stay physically fit. Do strength and balance exercises. Consider taking a regular class to build strength and balance. Yoga and tai chi are good options. Vision Have your eyes checked every year and your prescription for glasses or contacts updated as needed. Shoes and walking aids Wear non-skid shoes. Wear shoes that have rubber soles and low heels. Do not wear high heels. Do not walk around the house in socks or slippers. Use a cane or walker as told by your provider. Home safety Attach secure railings on both sides of your stairs. Install grab bars for your bathtub, shower, and toilet. Use a non-skid mat in your bathtub or shower. Attach bath mats securely with double-sided, non-slip rug tape. Use good lighting in all rooms. Keep a flashlight near your bed. Make sure there is a clear path from your bed to the bathroom. Use night-lights. Do not use throw rugs. Make sure all carpeting is taped or tacked down securely. Remove all clutter from walkways and stairways, including extension cords. Repair uneven or broken steps and floors. Avoid walking on icy or slippery surfaces. Walk on the grass instead of on icy or slick sidewalks. Use ice melter to get rid of ice on walkways in the winter. Use a cordless phone. Questions to ask your health care provider Can you help me check my risk for a fall? Do any of my medicines make me more likely to fall? Should I take a vitamin D supplement? What exercises can I do to improve my strength and balance? Should I make an appointment to have my vision checked? Do I need a bone density test to check for weak bones (osteoporosis)? Would it help to use a cane or a  walker? Where to find more information Centers for Disease Control and Prevention, STEADI: TonerPromos.no Community-Based Fall Prevention Programs: TonerPromos.no [employer] on Aging: BaseRingTones.pl Contact a health care provider if: You fall at home. You are afraid of falling at home. You feel weak, drowsy, or dizzy. This information is not intended to replace advice given to you by your health care provider. Make sure you discuss any questions you have with your health care provider. Document Revised: 09/06/2021 Document Reviewed: 09/06/2021 Elsevier Patient Education  2024 Elsevier Inc.   Managing Pain Without Opioids Opioids are strong medicines used to treat moderate to severe pain. For some people, especially those who have long-term (chronic) pain, opioids may not be the best choice for pain management due to: Side effects like nausea, constipation, and sleepiness. The risk of addiction (opioid use disorder). The longer you take opioids, the greater your risk of addiction. Pain that  lasts for more than 3 months is called chronic pain. Managing chronic pain usually requires more than one approach and is often provided by a team of health care providers working together (multidisciplinary approach). Pain management may be done at a pain management center or pain clinic. How to manage pain without the use of opioids Use non-opioid medicines Non-opioid medicines for pain may include: Over-the-counter or prescription non-steroidal anti-inflammatory drugs (NSAIDs). These may be the first medicines used for pain. They work well for muscle and bone pain, and they reduce swelling. Acetaminophen. This over-the-counter medicine may work well for milder pain but not swelling. Antidepressants. These may be used to treat chronic pain. A certain type of antidepressant (tricyclics) is often used. These medicines are given in lower doses for pain than when used for depression. Anticonvulsants. These are  usually used to treat seizures but may also reduce nerve (neuropathic) pain. Muscle relaxants. These relieve pain caused by sudden muscle tightening (spasms). You may also use a pain medicine that is applied to the skin as a patch, cream, or gel (topical analgesic), such as a numbing medicine. These may cause fewer side effects than medicines taken by mouth. Do certain therapies as directed Some therapies can help with pain management. They include: Physical therapy. You will do exercises to gain strength and flexibility. A physical therapist may teach you exercises to move and stretch parts of your body that are weak, stiff, or painful. You can learn these exercises at physical therapy visits and practice them at home. Physical therapy may also involve: Massage. Heat wraps or applying heat or cold to affected areas. Electrical signals that interrupt pain signals (transcutaneous electrical nerve stimulation, TENS). Weak lasers that reduce pain and swelling (low-level laser therapy). Signals from your body that help you learn to regulate pain (biofeedback). Occupational therapy. This helps you to learn ways to function at home and work with less pain. Recreational therapy. This involves trying new activities or hobbies, such as a physical activity or drawing. Mental health therapy, including: Cognitive behavioral therapy (CBT). This helps you learn coping skills for dealing with pain. Acceptance and commitment therapy (ACT) to change the way you think and react to pain. Relaxation therapies, including muscle relaxation exercises and mindfulness-based stress reduction. Pain management counseling. This may be individual, family, or group counseling.  Receive medical treatments Medical treatments for pain management include: Nerve block injections. These may include a pain blocker and anti-inflammatory medicines. You may have injections: Near the spine to relieve chronic back or neck pain. Into  joints to relieve back or joint pain. Into nerve areas that supply a painful area to relieve body pain. Into muscles (trigger point injections) to relieve some painful muscle conditions. A medical device placed near your spine to help block pain signals and relieve nerve pain or chronic back pain (spinal cord stimulation device). Acupuncture. Follow these instructions at home Medicines Take over-the-counter and prescription medicines only as told by your health care provider. If you are taking pain medicine, ask your health care providers about possible side effects to watch out for. Do not drive or use heavy machinery while taking prescription opioid pain medicine. Lifestyle  Do not use drugs or alcohol to reduce pain. If you drink alcohol, limit how much you have to: 0-1 drink a day for women who are not pregnant. 0-2 drinks a day for men. Know how much alcohol is in a drink. In the [place] one drink equals one 12 oz bottle of beer (355  mL), one 5 oz glass of wine (148 mL), or one 1 oz glass of hard liquor (44 mL). Do not use any products that contain nicotine or tobacco. These products include cigarettes, chewing tobacco, and vaping devices, such as e-cigarettes. If you need help quitting, ask your health care provider. Eat a healthy diet and maintain a healthy weight. Poor diet and excess weight may make pain worse. Eat foods that are high in fiber. These include fresh fruits and vegetables, whole grains, and beans. Limit foods that are high in fat and processed sugars, such as fried and sweet foods. Exercise regularly. Exercise lowers stress and may help relieve pain. Ask your health care provider what activities and exercises are safe for you. If your health care provider approves, join an exercise class that combines movement and stress reduction. Examples include yoga and tai chi. Get enough sleep. Lack of sleep may make pain worse. Lower stress as much as possible. Practice stress  reduction techniques as told by your therapist. General instructions Work with all your pain management providers to find the treatments that work best for you. You are an important member of your pain management team. There are many things you can do to reduce pain on your own. Consider joining an online or in-person support group for people who have chronic pain. Keep all follow-up visits. This is important. Where to find more information You can find more information about managing pain without opioids from: American Academy of Pain Medicine: painmed.org Institute for Chronic Pain: instituteforchronicpain.org American Chronic Pain Association: theacpa.org Contact a health care provider if: You have side effects from pain medicine. Your pain gets worse or does not get better with treatments or home therapy. You are struggling with anxiety or depression. Summary Many types of pain can be managed without opioids. Chronic pain may respond better to pain management without opioids. Pain is best managed when you and a team of health care providers work together. Pain management without opioids may include non-opioid medicines, medical treatments, physical therapy, mental health therapy, and lifestyle changes. Tell your health care providers if your pain gets worse or is not being managed well enough. This information is not intended to replace advice given to you by your health care provider. Make sure you discuss any questions you have with your health care provider. Document Revised: 04/15/2020 Document Reviewed: 04/15/2020 Elsevier Patient Education  2024 [employer].

## 2023-03-10 ENCOUNTER — Telehealth: Payer: Self-pay | Admitting: *Deleted

## 2023-03-10 ENCOUNTER — Ambulatory Visit: Payer: Self-pay | Admitting: *Deleted

## 2023-03-10 DIAGNOSIS — M2550 Pain in unspecified joint: Secondary | ICD-10-CM

## 2023-03-10 DIAGNOSIS — M450 Ankylosing spondylitis of multiple sites in spine: Secondary | ICD-10-CM

## 2023-03-10 DIAGNOSIS — E785 Hyperlipidemia, unspecified: Secondary | ICD-10-CM

## 2023-03-10 NOTE — Progress Notes (Signed)
 Complex Care Management Note  Care Guide Note 03/10/2023 Name: Joshua Kramer MRN: [id number] DOB: 28-Oct-1955  Joshua Kramer is a 68 y.o. year old male who sees Pickard, Priscille Heidelberg, MD for primary care. I reached out to [name] by phone today to offer complex care management services.  Mr. Corbo was given information about Complex Care Management services today including:   The Complex Care Management services include support from the care team which includes your Nurse Care Manager, Clinical Social Worker, or Pharmacist.  The Complex Care Management team is here to help remove barriers to the health concerns and goals most important to you. Complex Care Management services are voluntary, and the patient may decline or stop services at any time by request to their care team member.   Complex Care Management Consent Status: Patient agreed to services and verbal consent obtained.   Follow up plan:  Telephone appointment with complex care management team member scheduled for:  2/21  Encounter Outcome:  Patient Scheduled  Gwenevere Ghazi  [facility]  [facility], [facility] Guide  Direct Dial: [phone]  Fax [phone]

## 2023-03-10 NOTE — Patient Outreach (Addendum)
 Care Coordination   Initial Visit Note   03/10/2023 Name: Joshua Kramer MRN: [id number] DOB: July 05, 1955  Joshua Kramer is a 68 y.o. year old male who sees Pickard, Priscille Heidelberg, MD for primary care. I spoke with  [name] by phone today.  What matters to the patients health and wellness today?   Car repairs needed/transportation, utility, food, finance issues   on disability, wife also on disability  Transportation benefit discontinued via insurance coverage  No longer smokes  Agrees to have referral to care guide  Finances/Utility bill -called [employer] recently for assist but none available  Has not tried the DSS yet recently. Previous assistance received   Not veteran   not set up meals on wheels at this time   Goals Addressed             This Visit's Progress    Patient will receive resources for social determinant of health (finances, transportation, utility, food insecurity   Not on track    Interventions Today    Flowsheet Row Most Recent Value  Chronic Disease   Chronic disease during today's visit Other  General Interventions   General Interventions Discussed/Reviewed General Interventions Discussed, Programmer, applications, Doctor Visits  Doctor Visits Discussed/Reviewed Doctor Visits Discussed, PCP  PCP/Specialist Visits Compliance with follow-up visit  Education Interventions   Education Provided Provided Education  Provided Verbal Education On [employer], Other  [discussed care guide services]  Mental Health Interventions   Mental Health Discussed/Reviewed Mental Health Discussed, Coping Strategies  Safety Interventions   Safety Discussed/Reviewed Safety Discussed, Home Safety  Home Safety Assistive Devices  Advanced Directive Interventions   Advanced Directives Discussed/Reviewed Advanced Directives Discussed  [no preference at this time]              SDOH assessments and interventions completed:  Yes     Care Coordination  Interventions:  Yes, provided   Follow up plan: No further intervention required.   Encounter Outcome:  Patient Visit Completed   Cala Bradford L. Noelle Penner, RN, BSN, CCM [facility], [facility] RN Care Manager Direct Dial: [phone]  Fax: [phone] Mailing Address: [address]  [place] [address] Website: [email or web address]

## 2023-03-13 ENCOUNTER — Other Ambulatory Visit: Payer: Self-pay | Admitting: Family Medicine

## 2023-03-13 ENCOUNTER — Telehealth: Payer: Self-pay | Admitting: *Deleted

## 2023-03-13 ENCOUNTER — Telehealth: Payer: Self-pay

## 2023-03-13 DIAGNOSIS — M450 Ankylosing spondylitis of multiple sites in spine: Secondary | ICD-10-CM

## 2023-03-13 MED ORDER — HYDROCODONE-ACETAMINOPHEN 7.5-325 MG PO TABS: 1.0000 | ORAL_TABLET | Freq: Four times a day (QID) | ORAL | 0 refills | Status: DC | PRN

## 2023-03-13 MED ORDER — TIZANIDINE HCL 4 MG PO TABS: 4.0000 mg | ORAL_TABLET | Freq: Three times a day (TID) | ORAL | 0 refills | Status: DC | PRN

## 2023-03-13 NOTE — Telephone Encounter (Signed)
 Copied from CRM [id number]. Topic: Clinical - Medication Refill >> Mar 13, 2023 11:08 AM Gery Pray wrote: Most Recent Primary Care Visit:  Provider: Recardo Evangelist A  [facility]  Visit Type: MEDICARE AWV, SEQUENTIAL  Date: 03/09/2023  Medication: HYDROcodone-acetaminophen (NORCO) 7.5-325 MG tablet, tiZANidine (ZANAFLEX) 4 MG tablet  Has the patient contacted their pharmacy? Yes (Agent: If no, request that the patient contact the pharmacy for the refill. If patient does not wish to contact the pharmacy document the reason why and proceed with request.) (Agent: If yes, when and what did the pharmacy advise?)  Is this the correct pharmacy for this prescription? Yes If no, delete pharmacy and type the correct one.  This is the patient's preferred pharmacy:  [id number] [place], [place] - [address] AT [facility] OF [facility] [address] & [facility] [address] [place] [address] Phone: [phone] Fax: [phone]   Has the prescription been filled recently? No  Is the patient out of the medication? Yes for the HYDROcodone & No tiZANidine  Has the patient been seen for an appointment in the last year OR does the patient have an upcoming appointment? Yes  Can we respond through MyChart? Yes  Agent: Please be advised that Rx refills may take up to 3 business days. We ask that you follow-up with your pharmacy.

## 2023-03-13 NOTE — Progress Notes (Signed)
 Complex Care Management Note Care Guide Note  03/13/2023 Name: Magnus Crescenzo MRN: [id number] DOB: 10-23-1955  Tetsuo Coppola is a 68 y.o. year old male who is a primary care patient of Pickard, Priscille Heidelberg, MD . The community resource team was consulted for assistance with Transportation Needs  and Financial Difficulties related to utilities   SDOH screenings and interventions completed:  Yes     SDOH Interventions Today    Flowsheet Row Most Recent Value  SDOH Interventions   Transportation Interventions Community Resources Provided  [employer] application offered]  Financial Strain Interventions --  [sent referral from [place] [facility] and also provided transportation options to patient will mail some supportive  information]        Care guide performed the following interventions: sent referral from [place] [facility] and also provided transportation options to patient will mail some supportive information .  Follow Up Plan:  No further follow up planned at this time. The patient has been provided with needed resources.  Encounter Outcome:  Patient Visit Completed Dione Booze  [facility] HealthPopulation Health Care Guide  Direct Dial:[phone] [phone] Website: [email or web address]

## 2023-04-07 ENCOUNTER — Other Ambulatory Visit: Payer: Self-pay | Admitting: Family Medicine

## 2023-04-07 DIAGNOSIS — M450 Ankylosing spondylitis of multiple sites in spine: Secondary | ICD-10-CM

## 2023-04-07 NOTE — Telephone Encounter (Signed)
 Copied from CRM [id number]. Topic: Clinical - Medication Refill >> Apr 07, 2023  1:09 PM Alphonzo Lemmings O wrote: Most Recent Primary Care Visit:  Provider: Recardo Evangelist A  Department: [facility]  Visit Type: MEDICARE AWV, SEQUENTIAL  Date: 03/09/2023  Medication: HYDROcodone-acetaminophen (NORCO) 7.5-325 MG tablet   Has the patient contacted their pharmacy? No always have to call doctor  (Agent: If no, request that the patient contact the pharmacy for the refill. If patient does not wish to contact the pharmacy document the reason why and proceed with request.) (Agent: If yes, when and what did the pharmacy advise?)  Is this the correct pharmacy for this prescription? Yes If no, delete pharmacy and type the correct one.  This is the patient's preferred pharmacy:  [facility] #[id number] [place], [place] - [address] AT [facility] OF [facility] [address] & [facility] [address] [place] [address] Phone: [phone] Fax: [phone]   Has the prescription been filled recently? No  Is the patient out of the medication? No just calling ahead because its due on the 24th . Dont want to run out   Has the patient been seen for an appointment in the last year OR does the patient have an upcoming appointment? Yes  Can we respond through MyChart? Yes  Agent: Please be advised that Rx refills may take up to 3 business days. We ask that you follow-up with your pharmacy.

## 2023-04-10 ENCOUNTER — Telehealth: Payer: Self-pay

## 2023-04-10 MED ORDER — HYDROCODONE-ACETAMINOPHEN 7.5-325 MG PO TABS
1.0000 | ORAL_TABLET | Freq: Four times a day (QID) | ORAL | 0 refills | Status: DC | PRN
Start: 2023-04-10 — End: 2023-04-11

## 2023-04-10 NOTE — Telephone Encounter (Signed)
 Copied from CRM [id number]. Topic: Clinical - Medication Refill >> Apr 07, 2023  1:09 PM Alphonzo Lemmings O wrote: Most Recent Primary Care Visit:  Provider: Recardo Evangelist A  [facility]: [facility]  Visit Type: MEDICARE AWV, SEQUENTIAL  Date: 03/09/2023  Medication: HYDROcodone-acetaminophen (NORCO) 7.5-325 MG tablet   Has the patient contacted their pharmacy? No always have to call doctor  (Agent: If no, request that the patient contact the pharmacy for the refill. If patient does not wish to contact the pharmacy document the reason why and proceed with request.) (Agent: If yes, when and what did the pharmacy advise?)  Is this the correct pharmacy for this prescription? Yes If no, delete pharmacy and type the correct one.  This is the patient's preferred pharmacy:  [facility] #[id number] [place], [place] - [address] AT [facility] OF [facility] [address] & [facility] [address] [place] [address] Phone: [phone] Fax: [phone]   Has the prescription been filled recently? No  Is the patient out of the medication? No just calling ahead because its due on the 24th . Dont want to run out   Has the patient been seen for an appointment in the last year OR does the patient have an upcoming appointment? Yes  Can we respond through MyChart? Yes  Agent: Please be advised that Rx refills may take up to 3 business days. We ask that you follow-up with your pharmacy. >> Apr 10, 2023 11:56 AM Abundio Miu S wrote: Patient calling to check the status of refill. Advised patient that it can take up to 3 business days to process request.

## 2023-04-10 NOTE — Telephone Encounter (Signed)
 Requested medications are due for refill today.  yes  Requested medications are on the active medications list.  yes  Last refill. 03/13/2023 #120 0 rf  Future visit scheduled.   yes  Notes to clinic.  Refill not delegated.    Requested Prescriptions  Pending Prescriptions Disp Refills   HYDROcodone-acetaminophen (NORCO) 7.5-325 MG tablet 120 tablet 0    Sig: Take 1 tablet by mouth every 6 (six) hours as needed.     Not Delegated - Analgesics:  Opioid Agonist Combinations Failed - 04/10/2023 11:08 AM      Failed - This refill cannot be delegated      Failed - Urine Drug Screen completed in last 360 days      Failed - Valid encounter within last 3 months    Recent Outpatient Visits           2 years ago Bilateral impacted cerumen   [facility] Donita Brooks, MD   2 years ago Colon cancer screening   [facility] Donita Brooks, MD   3 years ago Screening cholesterol level   [facility] Pickard, Priscille Heidelberg, MD   3 years ago Pure hypercholesterolemia   [facility] Tanya Nones, Priscille Heidelberg, MD   4 years ago Screening cholesterol level   [facility] Pickard, Priscille Heidelberg, MD

## 2023-04-11 ENCOUNTER — Other Ambulatory Visit: Payer: Self-pay | Admitting: Family Medicine

## 2023-04-11 DIAGNOSIS — M450 Ankylosing spondylitis of multiple sites in spine: Secondary | ICD-10-CM

## 2023-04-11 MED ORDER — HYDROCODONE-ACETAMINOPHEN 7.5-325 MG PO TABS
1.0000 | ORAL_TABLET | Freq: Four times a day (QID) | ORAL | 0 refills | Status: DC | PRN
Start: 2023-04-11 — End: 2023-06-08

## 2023-04-11 NOTE — Telephone Encounter (Signed)
 Requested medication (s) are due for refill today: Yes  Requested medication (s) are on the active medication list: Yes  Last refill:  03/13/23  Future visit scheduled: No  Notes to clinic:  Unable to refill per protocol, cannot delegate.      Requested Prescriptions  Pending Prescriptions Disp Refills   tiZANidine (ZANAFLEX) 4 MG tablet [Pharmacy Med Name: TIZANIDINE 4MG  TABLETS] 90 tablet 0    Sig: TAKE 1 TABLET(4 MG) BY MOUTH EVERY 8 HOURS AS NEEDED FOR MUSCLE SPASMS     Not Delegated - Cardiovascular:  Alpha-2 Agonists - tizanidine Failed - 04/11/2023  3:49 PM      Failed - This refill cannot be delegated      Failed - Valid encounter within last 6 months    Recent Outpatient Visits           2 years ago Bilateral impacted cerumen   [facility] Donita Brooks, MD   2 years ago Colon cancer screening   [facility] Donita Brooks, MD   3 years ago Screening cholesterol level   [facility] Pickard, Priscille Heidelberg, MD   3 years ago Pure hypercholesterolemia   [facility] Tanya Nones, Priscille Heidelberg, MD   4 years ago Screening cholesterol level   [facility] Pickard, Priscille Heidelberg, MD

## 2023-04-22 NOTE — Patient Instructions (Signed)
 Visit Information  Thank you for taking time to visit with me today. Please don't hesitate to contact me if I can be of assistance to you.   Following are the goals we discussed today:   Goals Addressed             This Visit's Progress    Patient will receive resources for social determinant of health (finances, transportation, utility, food insecurity   Not on track    Interventions Today    Flowsheet Row Most Recent Value  Chronic Disease   Chronic disease during today's visit Other  General Interventions   General Interventions Discussed/Reviewed General Interventions Discussed, Programmer, applications, Doctor Visits  Doctor Visits Discussed/Reviewed Doctor Visits Discussed, PCP  PCP/Specialist Visits Compliance with follow-up visit  Education Interventions   Education Provided Provided Education  Provided Verbal Education On [employer], Other  [discussed care guide services]  Mental Health Interventions   Mental Health Discussed/Reviewed Mental Health Discussed, Coping Strategies  Safety Interventions   Safety Discussed/Reviewed Safety Discussed, Home Safety  Home Safety Assistive Devices  Advanced Directive Interventions   Advanced Directives Discussed/Reviewed Advanced Directives Discussed  [no preference at this time]              Our next appointment is no further scheduled appointments.  on as needed at when needed  Please call the care guide team at [phone] if you need to cancel or reschedule your appointment.   If you are experiencing a Mental Health or Behavioral Health Crisis or need someone to talk to, please call the Suicide and Crisis Lifeline: 988 call the [place] National Suicide Prevention Lifeline: 618-186-2309 or TTY: 510-160-5076 TTY (727)074-9083) to talk to a trained counselor call 1-800-273-TALK (toll free, 24 hour hotline) call the [facility]: 803-291-4601 call 911   Patient verbalizes understanding of  instructions and care plan provided today and agrees to view in MyChart. Active MyChart status and patient understanding of how to access instructions and care plan via MyChart confirmed with patient.     The patient has been provided with contact information for the care management team and has been advised to call with any health related questions or concerns.    Stephanny Tsutsui L. Noelle Penner, RN, BSN, CCM [place]  [facility], [facility] RN Care Manager Direct Dial: [phone]  Fax: [phone]

## 2023-04-27 ENCOUNTER — Ambulatory Visit (INDEPENDENT_AMBULATORY_CARE_PROVIDER_SITE_OTHER): Payer: Medicare HMO | Admitting: *Deleted

## 2023-04-27 DIAGNOSIS — Z0000 Encounter for general adult medical examination without abnormal findings: Secondary | ICD-10-CM | POA: Diagnosis not present

## 2023-04-27 NOTE — Progress Notes (Signed)
 Subjective:   Joshua Kramer is a 68 y.o. male who presents for Medicare Annual/Subsequent preventive examination.  Visit Complete: Virtual I connected with  Joshua Kramer on 04/27/23 by a audio enabled telemedicine application and verified that I am speaking with the correct person using two identifiers.  Patient Location: Home  Provider Location: Home Office  I discussed the limitations of evaluation and management by telemedicine. The patient expressed understanding and agreed to proceed.  Vital Signs: Because this visit was a virtual/telehealth visit, some criteria may be missing or patient reported. Any vitals not documented were not able to be obtained and vitals that have been documented are patient reported.   Cardiac Risk Factors include: advanced age (>50men, >53 women)     Objective:    Today's Vitals   04/27/23 1521  PainSc: 5    There is no height or weight on file to calculate BMI.     04/27/2023    3:23 PM 03/10/2023    6:06 PM 03/09/2023    2:30 PM 03/03/2022    2:50 PM 06/01/2015   10:58 AM 01/06/2015    1:42 PM  Advanced Directives  Does Patient Have a Medical Advance Directive? No No No Yes No No  Type of Advance Directive    Living will;Healthcare Power of Attorney    Does patient want to make changes to medical advance directive?    No - Patient declined    Copy of Healthcare Power of Attorney in Chart?    No - copy requested    Would patient like information on creating a medical advance directive?  No - Patient declined No - Patient declined  No - patient declined information No - patient declined information    Current Medications (verified) Outpatient Encounter Medications as of 04/27/2023  Medication Sig   HYDROcodone-acetaminophen (NORCO) 7.5-325 MG tablet Take 1 tablet by mouth every 6 (six) hours as needed.   Thiamine HCl (VITAMIN B-1) 250 MG tablet Take 250 mg by mouth daily.   tiZANidine (ZANAFLEX) 4 MG tablet TAKE 1 TABLET(4 MG) BY MOUTH EVERY  8 HOURS AS NEEDED FOR MUSCLE SPASMS   vitamin E 180 MG (400 UNITS) capsule Take 400 Units by mouth daily.   Meloxicam 10 MG CAPS Take by mouth. (Patient not taking: Reported on 04/27/2023)   No facility-administered encounter medications on file as of 04/27/2023.    Allergies (verified) Patient has no known allergies.   History: Past Medical History:  Diagnosis Date   Ankylosing spondylitis of multiple sites in spine (HCC)    Chest pain 05/2015   a. abnormal nuc stress test with normal cors and LV function on subsequent cath   History of tobacco abuse    Past Surgical History:  Procedure Laterality Date   CARDIAC CATHETERIZATION  2011   nl cors, nl EF   CARDIAC CATHETERIZATION N/A 06/02/2015   Procedure: Left Heart Cath and Coronary Angiography;  Surgeon: Lennette Bihari, MD;  Location: [facility] INVASIVE CV LAB;  Service: Cardiovascular;  Laterality: N/A;   Family History  Problem Relation Age of Onset   Arthritis Mother    Social History   Socioeconomic History   Marital status: Married    Spouse name: Not on file   Number of children: 1   Years of education: Not on file   Highest education level: Not on file  Occupational History   Occupation: Sales  Tobacco Use   Smoking status: Former    Current packs/day: 0.00  Types: Cigarettes    Quit date: 06/18/2014    Years since quitting: 8.8   Smokeless tobacco: Never  Substance and Sexual Activity   Alcohol use: No    Alcohol/week: 0.0 standard drinks of alcohol   Drug use: No   Sexual activity: Yes  Other Topics Concern   Not on file  Social History Narrative   Previously was a marathon runner    Lives with wife; 1 daughter    Social Drivers of Corporate investment banker Strain: Medium Risk (04/27/2023)   Overall Financial Resource Strain (CARDIA)    Difficulty of Paying Living Expenses: Somewhat hard  Food Insecurity: No Food Insecurity (04/27/2023)   Hunger Vital Sign    Worried About Running Out of Food in the Last  Year: Never true    Ran Out of Food in the Last Year: Never true  Transportation Needs: No Transportation Needs (04/27/2023)   PRAPARE - Administrator, Civil Service (Medical): No    Lack of Transportation (Non-Medical): No  Recent Concern: Transportation Needs - Unmet Transportation Needs (03/09/2023)   PRAPARE - Transportation    Lack of Transportation (Medical): Yes    Lack of Transportation (Non-Medical): Yes  Physical Activity: Inactive (04/27/2023)   Exercise Vital Sign    Days of Exercise per Week: 0 days    Minutes of Exercise per Session: 0 min  Stress: Stress Concern Present (04/27/2023)   [employer] of Occupational Health - Occupational Stress Questionnaire    Feeling of Stress : To some extent  Social Connections: Moderately Integrated (04/27/2023)   Social Connection and Isolation Panel [NHANES]    Frequency of Communication with Friends and Family: Three times a week    Frequency of Social Gatherings with Friends and Family: Once a week    Attends Religious Services: 1 to 4 times per year    Active Member of [employer] or Organizations: No    Attends Engineer, structural: Never    Marital Status: Married    Tobacco Counseling Counseling given: Not Answered   Clinical Intake:  Pre-visit preparation completed: Yes  Pain : 0-10 Pain Score: 5  Pain Type: Chronic pain Pain Location: Knee Pain Descriptors / Indicators: Burning, Aching, Dull, Constant     Diabetes: No  How often do you need to have someone help you when you read instructions, pamphlets, or other written materials from your doctor or pharmacy?: 1 - Never  Interpreter Needed?: No  Information entered by :: Remi Haggard LPN   Activities of Daily Living    04/27/2023    3:31 PM 03/09/2023    2:27 PM  In your present state of health, do you have any difficulty performing the following activities:  Hearing? 0 0  Vision? 0 0  Difficulty concentrating or making decisions? 0 0   Walking or climbing stairs? 0 1  Comment  patient has ankylosing spondylitis  Dressing or bathing? 0 1  Comment  has difficulty but is able to do most of it alone.  Doing errands, shopping? 0 0  Preparing Food and eating ? N N  Using the Toilet? N N  In the past six months, have you accidently leaked urine? N N  Do you have problems with loss of bowel control? N N  Managing your Medications? N N  Managing your Finances? N N  Housekeeping or managing your Housekeeping? N Y  Comment  wife helps him    Patient Care Team: Donita Brooks,  MD as PCP - General (Family Medicine)  Indicate any recent Medical Services you may have received from other than [facility] providers in the past year (date may be approximate).     Assessment:   This is a routine wellness examination for [place].  Hearing/Vision screen Hearing Screening - Comments:: No trouble hearing Vision Screening - Comments:: Up to date Has a cataract surgery    Goals Addressed             This Visit's Progress    Patient Stated       Try to get out of pain as much as possible         Depression Screen    04/27/2023    3:34 PM 03/09/2023    2:31 PM 03/03/2022    2:49 PM 02/28/2020    2:59 PM 02/28/2020    2:58 PM 05/04/2017    8:18 AM  PHQ 2/9 Scores  PHQ - 2 Score 2 0 0 0 0 0  PHQ- 9 Score 9 0        Fall Risk    04/27/2023    3:23 PM 03/09/2023    2:08 PM 03/31/2022   12:10 PM 03/03/2022    2:48 PM 02/28/2020    2:58 PM  Fall Risk   Falls in the past year? 1 1 1  0   Number falls in past yr: 0 1 1 0 0  Injury with Fall? 1 0 1 0 0  Risk for fall due to : Impaired balance/gait History of fall(s);Impaired balance/gait;Orthopedic patient;Impaired mobility History of fall(s);Impaired balance/gait;Orthopedic patient    Follow up Falls evaluation completed;Education provided;Falls prevention discussed Falls prevention discussed;Education provided Education provided;Falls prevention discussed Falls prevention  discussed;Education provided;Falls evaluation completed     MEDICARE RISK AT HOME: Medicare Risk at Home Any stairs in or around the home?: No If so, are there any without handrails?: No Home free of loose throw rugs in walkways, pet beds, electrical cords, etc?: Yes Adequate lighting in your home to reduce risk of falls?: Yes Life alert?: No Use of a cane, walker or w/c?: Yes Grab bars in the bathroom?: Yes Shower chair or bench in shower?: Yes Elevated toilet seat or a handicapped toilet?: Yes  TIMED UP AND GO:  Was the test performed?  No    Cognitive Function:        04/27/2023    3:29 PM 03/09/2023    2:19 PM 03/03/2022    2:51 PM  6CIT Screen  What Year? 0 points 0 points 0 points  What month? 0 points 0 points 0 points  What time? 0 points 0 points 0 points  Count back from 20 0 points 0 points 0 points  Months in reverse 0 points 0 points 0 points  Repeat phrase 0 points 0 points 0 points  Total Score 0 points 0 points 0 points    Immunizations  There is no immunization history on file for this patient.  TDAP status: Due, Education has been provided regarding the importance of this vaccine. Advised may receive this vaccine at local pharmacy or Health Dept. Aware to provide a copy of the vaccination record if obtained from local pharmacy or Health Dept. Verbalized acceptance and understanding.  Flu Vaccine status: Up to date  Pneumococcal vaccine status: Due, Education has been provided regarding the importance of this vaccine. Advised may receive this vaccine at local pharmacy or Health Dept. Aware to provide a copy of the vaccination record if obtained  from local pharmacy or Health Dept. Verbalized acceptance and understanding.  Covid-19 vaccine status: Information provided on how to obtain vaccines.   Qualifies for Shingles Vaccine? Yes   Zostavax completed No   Shingrix Completed?: No.    Education has been provided regarding the importance of this vaccine.  Patient has been advised to call insurance company to determine out of pocket expense if they have not yet received this vaccine. Advised may also receive vaccine at local pharmacy or Health Dept. Verbalized acceptance and understanding.  Screening Tests Health Maintenance  Topic Date Due   Hepatitis C Screening  Never done   Pneumonia Vaccine 36+ Years old (1 of 2 - PCV) Never done   Colonoscopy  Never done   Zoster Vaccines- Shingrix (1 of 2) Never done   COVID-19 Vaccine (1 - 2024-25 season) Never done   INFLUENZA VACCINE  08/18/2023   Medicare Annual Wellness (AWV)  04/26/2024   HPV VACCINES  Aged Out   Meningococcal B Vaccine  Aged Out   DTaP/Tdap/Td  Discontinued    Health Maintenance  Health Maintenance Due  Topic Date Due   Hepatitis C Screening  Never done   Pneumonia Vaccine 31+ Years old (1 of 2 - PCV) Never done   Colonoscopy  Never done   Zoster Vaccines- Shingrix (1 of 2) Never done   COVID-19 Vaccine (1 - 2024-25 season) Never done    Colorectal cancer screening: Type of screening: Cologuard. Completed 1. Repeat every positive   Education provided  years  Lung Cancer Screening: (Low Dose CT Chest recommended if Age 65-80 years, 20 pack-year currently smoking OR have quit w/in 15years.) does not qualify.   Lung Cancer Screening Referral:   Additional Screening:  Hepatitis C Screening   Vision Screening: Recommended annual ophthalmology exams for early detection of glaucoma and other disorders of the eye. Is the patient up to date with their annual eye exam?  No  Who is the provider or what is the name of the office in which the patient attends annual eye exams? Education provided If pt is not established with a provider, would they like to be referred to a provider to establish care? No .   Dental Screening: Recommended annual dental exams for proper oral hygiene    Community Resource Referral / Chronic Care Management: CRR required this visit?  No    CCM required this visit?  No     Plan:     I have personally reviewed and noted the following in the patient's chart:   Medical and social history Use of alcohol, tobacco or illicit drugs  Current medications and supplements including opioid prescriptions. Patient is not currently taking opioid prescriptions. Functional ability and status Nutritional status Physical activity Advanced directives List of other physicians Hospitalizations, surgeries, and ER visits in previous 12 months Vitals Screenings to include cognitive, depression, and falls Referrals and appointments  In addition, I have reviewed and discussed with patient certain preventive protocols, quality metrics, and best practice recommendations. A written personalized care plan for preventive services as well as general preventive health recommendations were provided to patient.     Remi Haggard, LPN   02/05/1476   After Visit Summary: (MyChart) Due to this being a telephonic visit, the after visit summary with patients personalized plan was offered to patient via MyChart   Nurse Notes:

## 2023-04-27 NOTE — Patient Instructions (Signed)
 Joshua Kramer , Thank you for taking time to come for your Medicare Wellness Visit. I appreciate your ongoing commitment to your health goals. Please review the following plan we discussed and let me know if I can assist you in the future.   Screening recommendations/referrals: Colonoscopy: Education provided Recommended yearly ophthalmology/optometry visit for glaucoma screening and checkup Recommended yearly dental visit for hygiene and checkup  Vaccinations: Influenza vaccine: up tod ate Pneumococcal vaccine: Education provided Tdap vaccine: Education provided provided Shingles vaccine: Education provided      Preventive Care 65 Years and Older, Male Preventive care refers to lifestyle choices and visits with your health care provider that can promote health and wellness. What does preventive care include? A yearly physical exam. This is also called an annual well check. Dental exams once or twice a year. Routine eye exams. Ask your health care provider how often you should have your eyes checked. Personal lifestyle choices, including: Daily care of your teeth and gums. Regular physical activity. Eating a healthy diet. Avoiding tobacco and drug use. Limiting alcohol use. Practicing safe sex. Taking low doses of aspirin every day. Taking vitamin and mineral supplements as recommended by your health care provider. What happens during an annual well check? The services and screenings done by your health care provider during your annual well check will depend on your age, overall health, lifestyle risk factors, and family history of disease. Counseling  Your health care provider may ask you questions about your: Alcohol use. Tobacco use. Drug use. Emotional well-being. Home and relationship well-being. Sexual activity. Eating habits. History of falls. Memory and ability to understand (cognition). Work and work Astronomer. Screening  You may have the following tests or  measurements: Height, weight, and BMI. Blood pressure. Lipid and cholesterol levels. These may be checked every 5 years, or more frequently if you are over 54 years old. Skin check. Lung cancer screening. You may have this screening every year starting at age 49 if you have a 30-pack-year history of smoking and currently smoke or have quit within the past 15 years. Fecal occult blood test (FOBT) of the stool. You may have this test every year starting at age 6. Flexible sigmoidoscopy or colonoscopy. You may have a sigmoidoscopy every 5 years or a colonoscopy every 10 years starting at age 85. Prostate cancer screening. Recommendations will vary depending on your family history and other risks. Hepatitis C blood test. Hepatitis B blood test. Sexually transmitted disease (STD) testing. Diabetes screening. This is done by checking your blood sugar (glucose) after you have not eaten for a while (fasting). You may have this done every 1-3 years. Abdominal aortic aneurysm (AAA) screening. You may need this if you are a current or former smoker. Osteoporosis. You may be screened starting at age 20 if you are at high risk. Talk with your health care provider about your test results, treatment options, and if necessary, the need for more tests. Vaccines  Your health care provider may recommend certain vaccines, such as: Influenza vaccine. This is recommended every year. Tetanus, diphtheria, and acellular pertussis (Tdap, Td) vaccine. You may need a Td booster every 10 years. Zoster vaccine. You may need this after age 56. Pneumococcal 13-valent conjugate (PCV13) vaccine. One dose is recommended after age 103. Pneumococcal polysaccharide (PPSV23) vaccine. One dose is recommended after age 63. Talk to your health care provider about which screenings and vaccines you need and how often you need them. This information is not intended to replace advice  given to you by your health care provider. Make sure  you discuss any questions you have with your health care provider. Document Released: 01/30/2015 Document Revised: 09/23/2015 Document Reviewed: 11/04/2014 Elsevier Interactive Patient Education  2017 [employer].  Fall Prevention in the Home Falls can cause injuries. They can happen to people of all ages. There are many things you can do to make your home safe and to help prevent falls. What can I do on the outside of my home? Regularly fix the edges of walkways and driveways and fix any cracks. Remove anything that might make you trip as you walk through a door, such as a raised step or threshold. Trim any bushes or trees on the path to your home. Use bright outdoor lighting. Clear any walking paths of anything that might make someone trip, such as rocks or tools. Regularly check to see if handrails are loose or broken. Make sure that both sides of any steps have handrails. Any raised decks and porches should have guardrails on the edges. Have any leaves, snow, or ice cleared regularly. Use sand or salt on walking paths during winter. Clean up any spills in your garage right away. This includes oil or grease spills. What can I do in the bathroom? Use night lights. Install grab bars by the toilet and in the tub and shower. Do not use towel bars as grab bars. Use non-skid mats or decals in the tub or shower. If you need to sit down in the shower, use a plastic, non-slip stool. Keep the floor dry. Clean up any water that spills on the floor as soon as it happens. Remove soap buildup in the tub or shower regularly. Attach bath mats securely with double-sided non-slip rug tape. Do not have throw rugs and other things on the floor that can make you trip. What can I do in the bedroom? Use night lights. Make sure that you have a light by your bed that is easy to reach. Do not use any sheets or blankets that are too big for your bed. They should not hang down onto the floor. Have a firm  chair that has side arms. You can use this for support while you get dressed. Do not have throw rugs and other things on the floor that can make you trip. What can I do in the kitchen? Clean up any spills right away. Avoid walking on wet floors. Keep items that you use a lot in easy-to-reach places. If you need to reach something above you, use a strong step stool that has a grab bar. Keep electrical cords out of the way. Do not use floor polish or wax that makes floors slippery. If you must use wax, use non-skid floor wax. Do not have throw rugs and other things on the floor that can make you trip. What can I do with my stairs? Do not leave any items on the stairs. Make sure that there are handrails on both sides of the stairs and use them. Fix handrails that are broken or loose. Make sure that handrails are as long as the stairways. Check any carpeting to make sure that it is firmly attached to the stairs. Fix any carpet that is loose or worn. Avoid having throw rugs at the top or bottom of the stairs. If you do have throw rugs, attach them to the floor with carpet tape. Make sure that you have a light switch at the top of the stairs and the bottom of  the stairs. If you do not have them, ask someone to add them for you. What else can I do to help prevent falls? Wear shoes that: Do not have high heels. Have rubber bottoms. Are comfortable and fit you well. Are closed at the toe. Do not wear sandals. If you use a stepladder: Make sure that it is fully opened. Do not climb a closed stepladder. Make sure that both sides of the stepladder are locked into place. Ask someone to hold it for you, if possible. Clearly mark and make sure that you can see: Any grab bars or handrails. First and last steps. Where the edge of each step is. Use tools that help you move around (mobility aids) if they are needed. These include: Canes. Walkers. Scooters. Crutches. Turn on the lights when you go  into a dark area. Replace any light bulbs as soon as they burn out. Set up your furniture so you have a clear path. Avoid moving your furniture around. If any of your floors are uneven, fix them. If there are any pets around you, be aware of where they are. Review your medicines with your doctor. Some medicines can make you feel dizzy. This can increase your chance of falling. Ask your doctor what other things that you can do to help prevent falls. This information is not intended to replace advice given to you by your health care provider. Make sure you discuss any questions you have with your health care provider. Document Released: 10/30/2008 Document Revised: 06/11/2015 Document Reviewed: 02/07/2014 Elsevier Interactive Patient Education  2017 [employer].

## 2023-05-11 ENCOUNTER — Other Ambulatory Visit: Payer: Self-pay | Admitting: Family Medicine

## 2023-05-11 DIAGNOSIS — M450 Ankylosing spondylitis of multiple sites in spine: Secondary | ICD-10-CM

## 2023-05-12 NOTE — Telephone Encounter (Signed)
 Requested medication (s) are due for refill today: yes  Requested medication (s) are on the active medication list: yes  Last refill:  04/11/23  Future visit scheduled: no  Notes to clinic:  Unable to refill per protocol, cannot delegate.      Requested Prescriptions  Pending Prescriptions Disp Refills   tiZANidine  (ZANAFLEX ) 4 MG tablet [Pharmacy Med Name: TIZANIDINE  4MG  TABLETS] 90 tablet 0    Sig: TAKE 1 TABLET(4 MG) BY MOUTH EVERY 8 HOURS AS NEEDED FOR MUSCLE SPASMS     Not Delegated - Cardiovascular:  Alpha-2 Agonists - tizanidine  Failed - 05/12/2023  8:32 AM      Failed - This refill cannot be delegated      Passed - Valid encounter within last 6 months    Recent Outpatient Visits           1 year ago Ankylosing spondylitis of multiple sites in spine [facility])   [facility] Pickard, Cisco Crest, MD

## 2023-06-08 ENCOUNTER — Other Ambulatory Visit: Payer: Self-pay | Admitting: Family Medicine

## 2023-06-08 DIAGNOSIS — M450 Ankylosing spondylitis of multiple sites in spine: Secondary | ICD-10-CM

## 2023-06-08 NOTE — Telephone Encounter (Signed)
 Copied from CRM [id number]. Topic: Clinical - Medication Refill >> Jun 08, 2023  9:36 AM Lotus Round B wrote: Medication: HYDROcodone -acetaminophen  (NORCO) 7.5-325 MG tablet   Has the patient contacted their pharmacy? No (Agent: If no, request that the patient contact the pharmacy for the refill. If patient does not wish to contact the pharmacy document the reason why and proceed with request.) (Agent: If yes, when and what did the pharmacy advise?)  This is the patient's preferred pharmacy:  [id number] [place], [place] - [address] AT [facility] [address] [facility] [address] [facility] [address] [place] [address] Phone: [phone] Fax: [phone]  Is this the correct pharmacy for this prescription? Yes If no, delete pharmacy and type the correct one.   Has the prescription been filled recently? No  Is the patient out of the medication? Yes  Has the patient been seen for an appointment in the last year OR does the patient have an upcoming appointment? Yes  Can we respond through MyChart? Yes  Agent: Please be advised that Rx refills may take up to 3 business days. We ask that you follow-up with your pharmacy.

## 2023-06-09 ENCOUNTER — Other Ambulatory Visit: Payer: Self-pay | Admitting: Family Medicine

## 2023-06-09 DIAGNOSIS — M450 Ankylosing spondylitis of multiple sites in spine: Secondary | ICD-10-CM

## 2023-06-09 MED ORDER — HYDROCODONE-ACETAMINOPHEN 7.5-325 MG PO TABS
1.0000 | ORAL_TABLET | Freq: Four times a day (QID) | ORAL | 0 refills | Status: DC | PRN
Start: 2023-06-09 — End: 2023-07-11

## 2023-06-09 NOTE — Telephone Encounter (Signed)
 Requested medication (s) are due for refill today: yes  Requested medication (s) are on the active medication list: yes  Last refill:  04/11/23  Future visit scheduled: no  Notes to clinic:  Unable to refill per protocol, cannot delegate.      Requested Prescriptions  Pending Prescriptions Disp Refills   HYDROcodone -acetaminophen  (NORCO) 7.5-325 MG tablet 120 tablet 0    Sig: Take 1 tablet by mouth every 6 (six) hours as needed.     Not Delegated - Analgesics:  Opioid Agonist Combinations Failed - 06/09/2023 11:58 AM      Failed - This refill cannot be delegated      Failed - Urine Drug Screen completed in last 360 days      Failed - Valid encounter within last 3 months    Recent Outpatient Visits           1 year ago Ankylosing spondylitis of multiple sites in spine [facility])   [place] [facility] Pickard, Cisco Crest, MD

## 2023-07-07 ENCOUNTER — Other Ambulatory Visit: Payer: Self-pay | Admitting: Family Medicine

## 2023-07-07 DIAGNOSIS — M450 Ankylosing spondylitis of multiple sites in spine: Secondary | ICD-10-CM

## 2023-07-07 NOTE — Telephone Encounter (Unsigned)
 Copied from CRM [id number]. Topic: Clinical - Medication Refill >> Jul 07, 2023  2:56 PM Leory Rands wrote: Medication: HYDROcodone -acetaminophen  (NORCO) 7.5-325 MG tablet [[id number]]  Has the patient contacted their pharmacy? Yes (Agent: If no, request that the patient contact the pharmacy for the refill. If patient does not wish to contact the pharmacy document the reason why and proceed with request.) (Agent: If yes, when and what did the pharmacy advise?)  This is the patient's preferred pharmacy:  [facility] #[id number] [place], [place] - [address] AT [facility] OF [facility] [address] & [facility] [address] [place] [address] Phone: [phone] Fax: [phone]  Is this the correct pharmacy for this prescription? Yes If no, delete pharmacy and type the correct one.   Has the prescription been filled recently? Yes  Is the patient out of the medication? Yes  Has the patient been seen for an appointment in the last year OR does the patient have an upcoming appointment? Yes  Can we respond through MyChart? Yes  Agent: Please be advised that Rx refills may take up to 3 business days. We ask that you follow-up with your pharmacy.

## 2023-07-10 ENCOUNTER — Telehealth: Payer: Self-pay

## 2023-07-10 NOTE — Telephone Encounter (Signed)
 Copied from CRM [id number]. Topic: General - Other >> Jul 10, 2023 10:40 AM Carlatta H wrote: Reason for CRM: Patient needs letter for handicap placard to take to the DMV//Please advise via mychart

## 2023-07-11 ENCOUNTER — Telehealth: Payer: Self-pay

## 2023-07-11 ENCOUNTER — Other Ambulatory Visit: Payer: Self-pay

## 2023-07-11 DIAGNOSIS — M450 Ankylosing spondylitis of multiple sites in spine: Secondary | ICD-10-CM

## 2023-07-11 MED ORDER — HYDROCODONE-ACETAMINOPHEN 7.5-325 MG PO TABS: 1.0000 | ORAL_TABLET | Freq: Four times a day (QID) | ORAL | 0 refills | Status: DC | PRN

## 2023-07-11 NOTE — Telephone Encounter (Signed)
 Copied from CRM [id number]. Topic: General - Other >> Jul 11, 2023  9:57 AM Kevelyn M wrote: Reason for CRM: Patient is asking how he will get his Handicap placards. Please call him back #[phone]  or respond through Mychart. He's asking for this as soon as possible. He's asking if it can be mailed to him.

## 2023-07-11 NOTE — Telephone Encounter (Signed)
 Copied from CRM [id number]. Topic: Clinical - Prescription Issue >> Jul 11, 2023  9:55 AM Kevelyn M wrote: Reason for CRM: Patient requesting status on  HYDROcodone -acetaminophen  (NORCO) 7.5-325 MG tablet refill request.

## 2023-07-11 NOTE — Telephone Encounter (Signed)
 Requested medications are due for refill today.  yes  Requested medications are on the active medications list.  yes  Last refill. 06/09/2023 #120 0 rf  Future visit scheduled.   yes  Notes to clinic.  Refill not delegated.    Requested Prescriptions  Pending Prescriptions Disp Refills   HYDROcodone -acetaminophen  (NORCO) 7.5-325 MG tablet 120 tablet 0    Sig: Take 1 tablet by mouth every 6 (six) hours as needed.     Not Delegated - Analgesics:  Opioid Agonist Combinations Failed - 07/11/2023  9:47 AM      Failed - This refill cannot be delegated      Failed - Urine Drug Screen completed in last 360 days      Failed - Valid encounter within last 3 months    Recent Outpatient Visits           1 year ago Ankylosing spondylitis of multiple sites in spine [facility])   [place] [facility] Pickard, Butler DASEN, MD

## 2023-07-17 ENCOUNTER — Telehealth: Payer: Self-pay | Admitting: Family Medicine

## 2023-07-17 DIAGNOSIS — M450 Ankylosing spondylitis of multiple sites in spine: Secondary | ICD-10-CM

## 2023-07-17 NOTE — Telephone Encounter (Unsigned)
 Copied from CRM [id number]. Topic: Clinical - Medication Refill >> Jul 17, 2023  2:49 PM Sophia H wrote: Medication: tiZANidine  (ZANAFLEX ) 4 MG tablet   Has the patient contacted their pharmacy? No  This is the patient's preferred pharmacy:  [facility] [address] [place],  [place], [place] [address]  Is this the correct pharmacy for this prescription? Yes If no, delete pharmacy and type the correct one.   Has the prescription been filled recently? Yes  Is the patient out of the medication? Yes  Has the patient been seen for an appointment in the last year OR does the patient have an upcoming appointment? Yes, 07/17.  Can we respond through MyChart? Yes  Agent: Please be advised that Rx refills may take up to 3 business days. We ask that you follow-up with your pharmacy.

## 2023-07-19 NOTE — Telephone Encounter (Signed)
 Requested medication (s) are due for refill today: yes  Requested medication (s) are on the active medication list: yes  Last refill:  06/09/23  Future visit scheduled: yes  Notes to clinic:  Unable to refill per protocol, cannot delegate.      Requested Prescriptions  Pending Prescriptions Disp Refills   tiZANidine  (ZANAFLEX ) 4 MG tablet 90 tablet 0    Sig: Take 1 tablet (4 mg total) by mouth 3 (three) times daily.     Not Delegated - Cardiovascular:  Alpha-2 Agonists - tizanidine  Failed - 07/19/2023 12:42 PM      Failed - This refill cannot be delegated      Passed - Valid encounter within last 6 months    Recent Outpatient Visits           1 year ago Ankylosing spondylitis of multiple sites in spine [facility])   [place] [facility] Pickard, Butler DASEN, MD

## 2023-07-20 MED ORDER — TIZANIDINE HCL 4 MG PO TABS: 4.0000 mg | ORAL_TABLET | Freq: Three times a day (TID) | ORAL | 0 refills | Status: DC

## 2023-08-03 ENCOUNTER — Encounter: Payer: Self-pay | Admitting: Family Medicine

## 2023-08-03 ENCOUNTER — Ambulatory Visit: Admitting: Family Medicine

## 2023-08-03 VITALS — BP 132/82 | HR 45 | Temp 98.4°F | Ht 73.0 in | Wt 274.0 lb

## 2023-08-03 DIAGNOSIS — L989 Disorder of the skin and subcutaneous tissue, unspecified: Secondary | ICD-10-CM | POA: Diagnosis not present

## 2023-08-03 DIAGNOSIS — E785 Hyperlipidemia, unspecified: Secondary | ICD-10-CM | POA: Diagnosis not present

## 2023-08-03 DIAGNOSIS — R739 Hyperglycemia, unspecified: Secondary | ICD-10-CM

## 2023-08-03 DIAGNOSIS — M459 Ankylosing spondylitis of unspecified sites in spine: Secondary | ICD-10-CM

## 2023-08-03 DIAGNOSIS — L84 Corns and callosities: Secondary | ICD-10-CM | POA: Diagnosis not present

## 2023-08-03 LAB — CBC WITH DIFFERENTIAL/PLATELET
Absolute Lymphocytes: 1625 {cells}/uL (ref 850–3900)
Absolute Monocytes: 578 {cells}/uL (ref 200–950)
Basophils Absolute: 41 {cells}/uL (ref 0–200)
Basophils Relative: 0.6 %
Eosinophils Absolute: 102 {cells}/uL (ref 15–500)
Eosinophils Relative: 1.5 %
HCT: 45.7 % (ref 38.5–50.0)
Hemoglobin: 15 g/dL (ref 13.2–17.1)
MCH: 31 pg (ref 27.0–33.0)
MCHC: 32.8 g/dL (ref 32.0–36.0)
MCV: 94.4 fL (ref 80.0–100.0)
MPV: 10.2 fL (ref 7.5–12.5)
Monocytes Relative: 8.5 %
Neutro Abs: 4454 {cells}/uL (ref 1500–7800)
Neutrophils Relative %: 65.5 %
Platelets: 213 Thousand/uL (ref 140–400)
RBC: 4.84 Million/uL (ref 4.20–5.80)
RDW: 13.4 % (ref 11.0–15.0)
Total Lymphocyte: 23.9 %
WBC: 6.8 Thousand/uL (ref 3.8–10.8)

## 2023-08-03 LAB — HEMOGLOBIN A1C
Hgb A1c MFr Bld: 6.2 % — ABNORMAL HIGH (ref <5.7)
Mean Plasma Glucose: 131 mg/dL
eAG (mmol/L): 7.3 mmol/L

## 2023-08-03 LAB — COMPREHENSIVE METABOLIC PANEL WITH GFR
AG Ratio: 2 (calc) (ref 1.0–2.5)
ALT: 15 U/L (ref 9–46)
AST: 10 U/L (ref 10–35)
Albumin: 4.3 g/dL (ref 3.6–5.1)
Alkaline phosphatase (APISO): 72 U/L (ref 35–144)
BUN: 16 mg/dL (ref 7–25)
CO2: 28 mmol/L (ref 20–32)
Calcium: 9.2 mg/dL (ref 8.6–10.3)
Chloride: 106 mmol/L (ref 98–110)
Creat: 0.89 mg/dL (ref 0.70–1.35)
Globulin: 2.2 g/dL (ref 1.9–3.7)
Glucose, Bld: 147 mg/dL — ABNORMAL HIGH (ref 65–99)
Potassium: 4.6 mmol/L (ref 3.5–5.3)
Sodium: 142 mmol/L (ref 135–146)
Total Bilirubin: 0.7 mg/dL (ref 0.2–1.2)
Total Protein: 6.5 g/dL (ref 6.1–8.1)
eGFR: 93 mL/min/1.73m2 (ref >=60)

## 2023-08-03 LAB — LIPID PANEL
Cholesterol: 173 mg/dL (ref <200)
HDL: 39 mg/dL — ABNORMAL LOW (ref >=40)
LDL Cholesterol (Calc): 118 mg/dL — ABNORMAL HIGH
Non-HDL Cholesterol (Calc): 134 mg/dL — ABNORMAL HIGH (ref <130)
Total CHOL/HDL Ratio: 4.4 (calc) (ref <5.0)
Triglycerides: 72 mg/dL (ref <150)

## 2023-08-03 NOTE — Progress Notes (Signed)
 Subjective:    Patient ID: Joshua Kramer, male    DOB: 1955-11-17, 68 y.o.   MRN: [id number] Patient is a 68 year old Caucasian male with a history of ankylosing spondylitis.  He states that his disease has gotten unbearable.  He reports severe pain in his neck, his upper back, his lower back, both ankles, and his feet.  He states that his ankles are essentially fused due to his disease.  He is unable to bend them.  He is requesting additional pain medication.  He is currently taking hydrocodone  7.5 mg tablets every 6 hours.  He gets 120 tablets a month.  He is going to increase the strength.  He also has thick calluses forming on the plantar surfaces of both feet.  On his right foot, there is a quarter size preulcerative callus forming on the plantar surface of the third MTP.  This is very tender to palpation.  He has thick fissured skin and callus forming on the plantar surface of the first MTP joint.  This requires mechanical debridement.  He has a 1 cm pearly erythematous papule just below his right tragus.  This has telangiectasias on the surface and appears to be a basal cell cancer.  He also complains of hearing loss and he has bilateral cerumen impaction.  We were able to remove the right cerumen impaction with irrigation and lavage.  The left cerumen impaction was too firm. Past Medical History:  Diagnosis Date   Ankylosing spondylitis of multiple sites in spine (HCC)    Chest pain 05/2015   a. abnormal nuc stress test with normal cors and LV function on subsequent cath   History of tobacco abuse    Past Surgical History:  Procedure Laterality Date   CARDIAC CATHETERIZATION  2011   nl cors, nl EF   CARDIAC CATHETERIZATION N/A 06/02/2015   Procedure: Left Heart Cath and Coronary Angiography;  Surgeon: Debby DELENA Sor, MD;  [facility]: [facility];  Service: Cardiovascular;  Laterality: N/A;   Current Outpatient Medications on File Prior to Visit  Medication Sig Dispense Refill    HYDROcodone -acetaminophen  (NORCO) 7.5-325 MG tablet Take 1 tablet by mouth every 6 (six) hours as needed. 120 tablet 0   HYDROcodone -acetaminophen  (NORCO) 7.5-325 MG tablet Take 1 tablet by mouth every 6 (six) hours as needed. 120 tablet 0   Meloxicam 10 MG CAPS Take by mouth. (Patient not taking: Reported on 04/27/2023)     Thiamine HCl (VITAMIN B-1) 250 MG tablet Take 250 mg by mouth daily.     tiZANidine  (ZANAFLEX ) 4 MG tablet Take 1 tablet (4 mg total) by mouth 3 (three) times daily. 90 tablet 0   vitamin E 180 MG (400 UNITS) capsule Take 400 Units by mouth daily.     No current facility-administered medications on file prior to visit.   No Known Allergies Social History   Socioeconomic History   Marital status: Married    Spouse name: Not on file   Number of children: 1   Years of education: Not on file   Highest education level: Not on file  Occupational History   Occupation: Sales  Tobacco Use   Smoking status: Former    Current packs/day: 0.00    Types: Cigarettes    Quit date: 06/18/2014    Years since quitting: 9.1   Smokeless tobacco: Never  Substance and Sexual Activity   Alcohol use: No    Alcohol/week: 0.0 standard drinks of alcohol   Drug use: No  Sexual activity: Yes  Other Topics Concern   Not on file  Social History Narrative   Previously was a marathon runner    Lives with wife; 1 daughter    Social Drivers of Corporate investment banker Strain: Medium Risk (04/27/2023)   Overall Financial Resource Strain (CARDIA)    Difficulty of Paying Living Expenses: Somewhat hard  Food Insecurity: No Food Insecurity (04/27/2023)   Hunger Vital Sign    Worried About Running Out of Food in the Last Year: Never true    Ran Out of Food in the Last Year: Never true  Transportation Needs: No Transportation Needs (04/27/2023)   PRAPARE - Administrator, Civil Service (Medical): No    Lack of Transportation (Non-Medical): No  Recent Concern: Transportation  Needs - Unmet Transportation Needs (03/09/2023)   PRAPARE - Transportation    Lack of Transportation (Medical): Yes    Lack of Transportation (Non-Medical): Yes  Physical Activity: Inactive (04/27/2023)   Exercise Vital Sign    Days of Exercise per Week: 0 days    Minutes of Exercise per Session: 0 min  Stress: Stress Concern Present (04/27/2023)   [employer] of Occupational Health - Occupational Stress Questionnaire    Feeling of Stress : To some extent  Social Connections: Moderately Integrated (04/27/2023)   Social Connection and Isolation Panel    Frequency of Communication with Friends and Family: Three times a week    Frequency of Social Gatherings with Friends and Family: Once a week    Attends Religious Services: 1 to 4 times per year    Active Member of [employer] or Organizations: No    Attends Banker Meetings: Never    Marital Status: Married  Catering manager Violence: Not At Risk (04/27/2023)   Humiliation, Afraid, Rape, and Kick questionnaire    Fear of Current or Ex-Partner: No    Emotionally Abused: No    Physically Abused: No    Sexually Abused: No      Review of Systems  All other systems reviewed and are negative.      Objective:   Physical Exam Vitals reviewed.  Constitutional:      General: He is not in acute distress.    Appearance: Normal appearance. He is not ill-appearing or toxic-appearing.  HENT:     Right Ear: There is impacted cerumen.     Left Ear: There is impacted cerumen.  Cardiovascular:     Rate and Rhythm: Normal rate and regular rhythm.     Pulses:          Dorsalis pedis pulses are 2+ on the right side and 2+ on the left side.     Heart sounds: Normal heart sounds. No murmur heard. Pulmonary:     Effort: Pulmonary effort is normal. No respiratory distress.     Breath sounds: Normal breath sounds. No stridor. No wheezing, rhonchi or rales.  Musculoskeletal:     Cervical back: Rigidity and tenderness present. Decreased  range of motion.     Thoracic back: Tenderness and bony tenderness present. Decreased range of motion.     Lumbar back: Tenderness and bony tenderness present. Decreased range of motion.     Right ankle: Swelling present. Decreased range of motion.     Left ankle: Swelling present. Decreased range of motion.     Right foot: Bony tenderness present.     Left foot: Bony tenderness present.       Feet:  Feet:  Right foot:     Skin integrity: Callus and dry skin present.     Toenail Condition: Right toenails are abnormally thick and long.     Left foot:     Skin integrity: Callus and dry skin present.     Toenail Condition: Left toenails are abnormally thick and long.  Neurological:     Mental Status: He is alert.    Bilateral cerumen impaction    Assessment & Plan:  Pre-ulcerative corn or callous - Plan: Ambulatory referral to Podiatry  Ankylosing spondylitis, unspecified site of spine (HCC) - Plan: Ambulatory referral to Pain Clinic  Hyperglycemia - Plan: CBC with Differential/Platelet, Comprehensive metabolic panel with GFR, Hemoglobin A1c, Lipid panel  Skin lesion of neck - Plan: Ambulatory referral to Dermatology Regarding his preventative health care, the patient had a positive Cologuard in 2023.  He continues to refuse a referral for a colonoscopy.  I explained to him the implications that he refuses a referral.  He declines PSA testing.  He refuses the pneumonia vaccine or shingles vaccine.  I will check a CBC CMP hemoglobin A1c and a lipid panel as his last blood sugar was elevated.  I recommended a consultation with the pain clinic as I do not feel comfortable increasing the dose of his chronic products.  I believe that he would be a good candidate for Suboxone.  I will refer the patient to a podiatrist for debridement of the preulcerative calluses on the plantar surfaces of both feet.  I am concerned he has a basal cell cancer below his right ear.  I will consult dermatology.   We were able to remove the right cerumen impaction.  I recommended using Debrox drops for 5 days in the left ear and then returning so we can attempt this again

## 2023-08-04 ENCOUNTER — Ambulatory Visit: Payer: Self-pay | Admitting: Family Medicine

## 2023-08-04 ENCOUNTER — Telehealth: Payer: Self-pay

## 2023-08-04 ENCOUNTER — Other Ambulatory Visit: Payer: Self-pay

## 2023-08-04 MED ORDER — MOUNJARO 2.5 MG/0.5ML ~~LOC~~ SOAJ: 2.5000 mg | SUBCUTANEOUS | 0 refills | Status: AC

## 2023-08-04 NOTE — Progress Notes (Signed)
 My chart message sent to pt. W/ result letter and request for approval to start Mounjaro. PA request sent to pharmacy to start process in  good faith.

## 2023-08-07 ENCOUNTER — Telehealth: Payer: Self-pay | Admitting: Pharmacy Technician

## 2023-08-07 ENCOUNTER — Other Ambulatory Visit (HOSPITAL_COMMUNITY): Payer: Self-pay

## 2023-08-07 NOTE — Telephone Encounter (Signed)
 Ozempic /Mounjaro  is approved exclusively as an adjunct to diet and exercise to improve glycemic control in adults with type 2 diabetes mellitus. A review of patient's medical chart reveals no documented diagnosis of type 2 diabetes or an A1C indicative of diabetes. Therefore, they do not currently meet the criteria for prior authorization of this medication. If clinically appropriate, alternative options such as Saxenda , Zepbound , or Wegovy  may be considered for this patient.  *****If the A1C from today comes back over 6.5 and there is a new Type 2 diabetes diagnosis, we can run the prior authorization then.  A user error has taken place: encounter opened in error, closed for administrative reasons.

## 2023-08-07 NOTE — Telephone Encounter (Signed)
 Sent to provider for review and advise

## 2023-08-08 ENCOUNTER — Other Ambulatory Visit (HOSPITAL_COMMUNITY): Payer: Self-pay

## 2023-08-09 ENCOUNTER — Ambulatory Visit (INDEPENDENT_AMBULATORY_CARE_PROVIDER_SITE_OTHER): Admitting: Podiatry

## 2023-08-09 DIAGNOSIS — Q828 Other specified congenital malformations of skin: Secondary | ICD-10-CM

## 2023-08-09 NOTE — Progress Notes (Signed)
  Subjective:  Patient ID: Joshua Kramer, male    DOB: [birth year],  MRN: [id number]  Chief Complaint  Patient presents with   Callouses    Bilateral callus possible nail fungus     68 y.o. male presents with the above complaint.  Patient presents with right submetatarsal 2 porokeratotic lesion painful to touch is progressive and worsens with ambulation and shoe pressure would like to discuss treatment options for it.  He denies seeing anyone else prior to seeing me denies any other acute complaints.  Per chart ambulation he has a history of ankylosing spondylitis.   Review of Systems: Negative except as noted in the HPI. Denies N/V/F/Ch.  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 abnormal nuc stress test with normal cors and LV function on subsequent cath   History of tobacco abuse     Current Outpatient Medications:    tirzepatide  (MOUNJARO ) 2.5 MG/0.5ML Pen, Inject 2.5 mg into the skin once a week., Disp: 2 mL, Rfl: 0   HYDROcodone -acetaminophen  (NORCO) 7.5-325 MG tablet, Take 1 tablet by mouth every 6 (six) hours as needed., Disp: 120 tablet, Rfl: 0   HYDROcodone -acetaminophen  (NORCO) 7.5-325 MG tablet, Take 1 tablet by mouth every 6 (six) hours as needed., Disp: 120 tablet, Rfl: 0   Meloxicam 10 MG CAPS, Take by mouth. (Patient not taking: Reported on 08/03/2023), Disp: , Rfl:    Thiamine HCl (VITAMIN B-1) 250 MG tablet, Take 250 mg by mouth daily., Disp: , Rfl:    tiZANidine  (ZANAFLEX ) 4 MG tablet, Take 1 tablet (4 mg total) by mouth 3 (three) times daily., Disp: 90 tablet, Rfl: 0   vitamin E 180 MG (400 UNITS) capsule, Take 400 Units by mouth daily., Disp: , Rfl:   Social History   Tobacco Use  Smoking Status Former   Current packs/day: 0.00   Types: Cigarettes   Quit date: 06/18/2014   Years since quitting: 9.1  Smokeless Tobacco Never    No Known Allergies Objective:  There were no vitals  filed for this visit. There is no height or weight on file to calculate BMI. Constitutional Well developed. Well nourished.  Vascular Dorsalis pedis pulses palpable bilaterally. Posterior tibial pulses palpable bilaterally. Capillary refill normal to all digits.  No cyanosis or clubbing noted. Pedal hair growth normal.  Neurologic Normal speech. Oriented to person, place, and time. Epicritic sensation to light touch grossly present bilaterally.  Dermatologic Right submetatarsal 2 porokeratotic lesion with central nucleated core noted pain on palpation.  No pinpoint bleeding noted with debridement.  Orthopedic: Normal joint ROM without pain or crepitus bilaterally. No visible deformities. No bony tenderness.   Radiographs: None Assessment:   1. Porokeratosis    Plan:  Patient was evaluated and treated and all questions answered.  Right submetatarsal 2 porokeratosis - All questions and concerns were discussed with the patient in extensive detail given the amount of pain that he is having he will benefit from debridement of the lesion using chisel blade handle the lesion was debrided down to healthy striated tissue no complication noted no pinpoint bleeding noted. - Shoe gear modification extensively discussed   No follow-ups on file.

## 2023-08-11 ENCOUNTER — Other Ambulatory Visit: Payer: Self-pay | Admitting: Family Medicine

## 2023-08-11 DIAGNOSIS — M450 Ankylosing spondylitis of multiple sites in spine: Secondary | ICD-10-CM

## 2023-08-11 NOTE — Telephone Encounter (Signed)
 Copied from CRM [id number]. Topic: Clinical - Medication Refill >> Aug 11, 2023  9:58 AM Willma R wrote: Medication: HYDROcodone -acetaminophen  (NORCO) 7.5-325 MG tablet  Has the patient contacted their pharmacy? Yes, call dr  This is the patient's preferred pharmacy:  [facility] STORE #[id number] [place], [place] - [address] AT [facility] [address] [facility] [address] & [facility] [address] [place] [address] Phone: [phone] Fax: [phone]  Is this the correct pharmacy for this prescription? Yes If no, delete pharmacy and type the correct one.   Has the prescription been filled recently? No  Is the patient out of the medication? Yes  Has the patient been seen for an appointment in the last year OR does the patient have an upcoming appointment? Yes  Can we respond through MyChart? Yes  Agent: Please be advised that Rx refills may take up to 3 business days. We ask that you follow-up with your pharmacy.

## 2023-08-14 ENCOUNTER — Telehealth: Payer: Self-pay

## 2023-08-14 ENCOUNTER — Other Ambulatory Visit (HOSPITAL_COMMUNITY): Payer: Self-pay

## 2023-08-14 MED ORDER — HYDROCODONE-ACETAMINOPHEN 7.5-325 MG PO TABS: 1.0000 | ORAL_TABLET | Freq: Four times a day (QID) | ORAL | 0 refills | Status: DC | PRN

## 2023-08-14 NOTE — Telephone Encounter (Signed)
 Requested medications are due for refill today.  yes  Requested medications are on the active medications list.  ys  Last refill. 07/11/2023 #120 0 rf  Future visit scheduled.   yes  Notes to clinic.  Refill not delegated.    Requested Prescriptions  Pending Prescriptions Disp Refills   HYDROcodone -acetaminophen  (NORCO) 7.5-325 MG tablet 120 tablet 0    Sig: Take 1 tablet by mouth every 6 (six) hours as needed.     Not Delegated - Analgesics:  Opioid Agonist Combinations Failed - 08/14/2023 10:14 AM      Failed - This refill cannot be delegated      Failed - Urine Drug Screen completed in last 360 days      Passed - Valid encounter within last 3 months    Recent Outpatient Visits           1 week ago Pre-ulcerative corn or callous   [facility] [name] DASEN, MD   1 year ago Ankylosing spondylitis of multiple sites in spine [facility])   [place] [facility] Pickard, Butler DASEN, MD

## 2023-08-14 NOTE — Telephone Encounter (Signed)
 Copied from CRM [id number]. Topic: Clinical - Medication Refill >> Aug 11, 2023  9:58 AM Willma R wrote: Medication: HYDROcodone -acetaminophen  (NORCO) 7.5-325 MG tablet  Has the patient contacted their pharmacy? Yes, call dr  This is the patient's preferred pharmacy:  [facility] STORE #[id number] [place], [place] - [address] AT [facility] OF [facility] [address] & [facility] [address] [place] [address] Phone: [phone] Fax: [phone]  Is this the correct pharmacy for this prescription? Yes If no, delete pharmacy and type the correct one.   Has the prescription been filled recently? No  Is the patient out of the medication? Yes  Has the patient been seen for an appointment in the last year OR does the patient have an upcoming appointment? Yes  Can we respond through MyChart? Yes  Agent: Please be advised that Rx refills may take up to 3 business days. We ask that you follow-up with your pharmacy. >> Aug 14, 2023 11:49 AM Donna BRAVO wrote: Patient calling in asking for update on medication refill patient is in pain and would like this refilled.  Patient stating this is an urgent matter. Patient phone [phone]

## 2023-08-18 ENCOUNTER — Telehealth: Payer: Self-pay

## 2023-08-18 ENCOUNTER — Other Ambulatory Visit: Payer: Self-pay | Admitting: Family Medicine

## 2023-08-18 DIAGNOSIS — H6123 Impacted cerumen, bilateral: Secondary | ICD-10-CM

## 2023-08-18 NOTE — Telephone Encounter (Signed)
 Messaged Rhonda Case in Billing to further assist.

## 2023-08-18 NOTE — Telephone Encounter (Signed)
 Copied from CRM [id number]. Topic: Medical Record Request - Payor/Billing Request >> Aug 18, 2023  1:09 PM Ivette P wrote: Reason for CRM: Pt received a bill for 03/14/204 and pt contacted insurance and was told by insurance that office never submitted billing claim to insurance for insurance to cover the cost. Pt now has a bill and would like to get it fixed.    PT callback [phone]

## 2023-08-18 NOTE — Telephone Encounter (Signed)
 Copied from CRM [id number]. Topic: Referral - Question >> Aug 18, 2023  1:07 PM Ivette P wrote: Reason for CRM: PT called in about ears being stuffed. MD, Pickard attempted to clean it and did not fully clean. Pt would like to know if he can be referred Nose Ear Doctor that can fully clean his ear.    Pt reach out to pt when referral is processed.

## 2023-08-22 ENCOUNTER — Other Ambulatory Visit: Payer: Self-pay | Admitting: Family Medicine

## 2023-08-22 DIAGNOSIS — M450 Ankylosing spondylitis of multiple sites in spine: Secondary | ICD-10-CM

## 2023-08-22 NOTE — Telephone Encounter (Signed)
 Patient requesting for ENT referral to be sent as soon as possible.   Patient states his ears are still stuffed.

## 2023-08-22 NOTE — Telephone Encounter (Signed)
 Copied from CRM [id number]. Topic: Clinical - Medication Refill >> Aug 22, 2023 10:47 AM Tobias L wrote: Medication: tiZANidine  (ZANAFLEX ) 4 MG tablet Patient requesting for rx to be sent to pharmacy below.   Has the patient contacted their pharmacy? Yes Told to contact the office.   This is the patient's preferred pharmacy:  [facility] [address] [place] - [address] [place] [address] Phone: [phone] Fax: [phone]  Is this the correct pharmacy for this prescription? Yes  Has the prescription been filled recently? No  Is the patient out of the medication? Yes  Has the patient been seen for an appointment in the last year OR does the patient have an upcoming appointment? Yes  Can we respond through MyChart? Yes  Agent: Please be advised that Rx refills may take up to 3 business days. We ask that you follow-up with your pharmacy.

## 2023-08-24 MED ORDER — TIZANIDINE HCL 4 MG PO TABS
4.0000 mg | ORAL_TABLET | Freq: Three times a day (TID) | ORAL | 0 refills | Status: DC
Start: 2023-08-24 — End: 2023-09-22

## 2023-08-24 NOTE — Telephone Encounter (Signed)
 Requested medication (s) are due for refill today:   Provider to review  Requested medication (s) are on the active medication list:   Yes  Future visit scheduled:    Yes 05/02/2024 AWV    LOV 08/03/2023 with Dr. Duanne   Last ordered: 07/20/2023 #90, 0 refills  Non delegated refill    Requested Prescriptions  Pending Prescriptions Disp Refills   tiZANidine  (ZANAFLEX ) 4 MG tablet 90 tablet 0    Sig: Take 1 tablet (4 mg total) by mouth 3 (three) times daily.     Not Delegated - Cardiovascular:  Alpha-2 Agonists - tizanidine  Failed - 08/24/2023  9:21 AM      Failed - This refill cannot be delegated      Passed - Valid encounter within last 6 months    Recent Outpatient Visits           3 weeks ago Pre-ulcerative corn or callous   Joshua Kramer [facility] [name] DASEN, MD   1 year ago Ankylosing spondylitis of multiple sites in spine [facility])   [place] Pickard, Butler DASEN, MD

## 2023-08-25 ENCOUNTER — Ambulatory Visit (INDEPENDENT_AMBULATORY_CARE_PROVIDER_SITE_OTHER): Admitting: Physician Assistant

## 2023-08-25 ENCOUNTER — Encounter (INDEPENDENT_AMBULATORY_CARE_PROVIDER_SITE_OTHER): Payer: Self-pay | Admitting: Physician Assistant

## 2023-08-25 VITALS — BP 181/84 | HR 46

## 2023-08-25 DIAGNOSIS — H6122 Impacted cerumen, left ear: Secondary | ICD-10-CM

## 2023-08-25 DIAGNOSIS — H9191 Unspecified hearing loss, right ear: Secondary | ICD-10-CM

## 2023-08-25 MED ORDER — OFLOXACIN 0.3 % OT SOLN: 5.0000 [drp] | Freq: Every day | OTIC | 0 refills | Status: AC

## 2023-08-25 NOTE — Progress Notes (Signed)
 Dear Dr. Duanne, Here is my assessment for our mutual patient, Joshua Kramer. Thank you for allowing me the opportunity to care for your patient. Please do not hesitate to contact me should you have any other questions. Sincerely, Chyrl Cohen PA-C  Otolaryngology Clinic Note Referring provider: Dr. Duanne HPI:  Joshua Kramer is a 68 y.o. male kindly referred by Dr. Duanne   The patient is a 68 year old gentleman seen in our office for evaluation of cerumen impaction and hearing loss.  The patient notes a significant past medical history of cerumen impaction.  He notes whenever this happens he has decreased hearing.  He would get his ears cleaned and the hearing would come back to normal.  He notes that recently he has had decreased hearing out of the right ear, he saw his primary care provider who attempted irrigation.  He was referred to our office for further removal.  The patient denies any pain in the ears, no trauma to the ears, no history of baseline decreased hearing.  Independent Review of Additional Tests or Records:  none   PMH/Meds/All/SocHx/FamHx/ROS:   Past Medical History:  Diagnosis Date   Ankylosing spondylitis of multiple sites in spine (HCC)    Chest pain 05/2015   a. abnormal nuc stress test with normal cors and LV function on subsequent cath   History of tobacco abuse      Past Surgical History:  Procedure Laterality Date   CARDIAC CATHETERIZATION  2011   nl cors, nl EF   CARDIAC CATHETERIZATION N/A 06/02/2015   Procedure: Left Heart Cath and Coronary Angiography;  Surgeon: Debby DELENA Sor, MD;  Location: [facility];  Service: Cardiovascular;  Laterality: N/A;    Family History  Problem Relation Age of Onset   Arthritis Mother      Social Connections: Moderately Integrated (04/27/2023)   Social Connection and Isolation Panel    Frequency of Communication with Friends and Family: Three times a week    Frequency of Social Gatherings with Friends and  Family: Once a week    Attends Religious Services: 1 to 4 times per year    Active Member of [employer] or Organizations: No    Attends Engineer, structural: Never    Marital Status: Married      Current Outpatient Medications:    tirzepatide  (MOUNJARO ) 2.5 MG/0.5ML Pen, Inject 2.5 mg into the skin once a week., Disp: 2 mL, Rfl: 0   HYDROcodone -acetaminophen  (NORCO) 7.5-325 MG tablet, Take 1 tablet by mouth every 6 (six) hours as needed., Disp: 120 tablet, Rfl: 0   HYDROcodone -acetaminophen  (NORCO) 7.5-325 MG tablet, Take 1 tablet by mouth every 6 (six) hours as needed., Disp: 120 tablet, Rfl: 0   Meloxicam 10 MG CAPS, Take by mouth. (Patient not taking: Reported on 08/03/2023), Disp: , Rfl:    Thiamine HCl (VITAMIN B-1) 250 MG tablet, Take 250 mg by mouth daily., Disp: , Rfl:    tiZANidine  (ZANAFLEX ) 4 MG tablet, Take 1 tablet (4 mg total) by mouth 3 (three) times daily., Disp: 90 tablet, Rfl: 0   vitamin E 180 MG (400 UNITS) capsule, Take 400 Units by mouth daily., Disp: , Rfl:    Physical Exam:   BP (!) 181/84   Pulse (!) 46   SpO2 96%   Pertinent Findings  CN II-XII intact Right external auditory canal with minimal cerumen burden, TM intact with well-pneumatized middle ear space, left EAC with cerumen impaction Anterior rhinoscopy: Septum midline; bilateral inferior turbinates with  normal hypertrophy No lesions of oral cavity/oropharynx; dentition within normal limits No obvious neck masses/lymphadenopathy/thyromegaly No respiratory distress or stridor  Seprately Identifiable Procedures:  Procedure: bilateral ear microscopy and cerumen removal using microscope (CPT [phone]) - Mod 25 Pre-procedure diagnosis: unilateral cerumen impaction left external auditory canal Post-procedure diagnosis: same Indication: bilateral cerumen impaction; given patient's otologic complaints and history as well as for improved and comprehensive examination of external ear and tympanic membrane,  bilateral otologic examination using microscope was performed and impacted cerumen removed  Procedure: Patient was placed semi-recumbent. Both ear canals were examined using the microscope with findings above. Cerumen removed from the left external auditory canal using suction and currette with improvement in EAC examination and patency. Left: EAC with minimal cerumen burden. TM was intact . Middle ear was aerated. Drainage: none Right: EAC was patent. TM was intact . Middle ear was aerated . Drainage: none Patient tolerated the procedure well.   Impression & Plans:  Joshua Kramer is a 68 y.o. male with the following   Cerumen impaction-  I was able to remove the majority of the cerumen of the left external auditory canal.  He did have some persistent cerumen as this was very hard.  I would like him to use otic drops for the next 5 to 7 days.  Hearing loss-  Patient reports ongoing hearing loss out of the right ear.  I would recommend audiological evaluation, I will see him in the office once this is complete to discuss further management.   - f/u office visit after audiological evaluation   Thank you for allowing me the opportunity to care for your patient. Please do not hesitate to contact me should you have any other questions.  Sincerely, Chyrl Cohen PA-C  [facility] Phone: [phone] Fax: [phone]  08/25/2023, 11:15 AM

## 2023-08-28 ENCOUNTER — Telehealth: Payer: Self-pay

## 2023-08-28 NOTE — Telephone Encounter (Signed)
 Copied from CRM [id number]. Topic: Referral - Request for Referral >> Aug 28, 2023 10:35 AM Delon DASEN wrote: Did the patient discuss referral with their provider in the last year? Yes (If No - schedule appointment) (If Yes - send message)  Appointment offered? No  Type of order/referral and detailed reason for visit: eye specialist  Preference of office, provider, location: no preference- [place]   If referral order, have you been seen by this specialty before? Yes (If Yes, this issue or another issue? When? Where?  Can we respond through MyChart? Yes

## 2023-08-28 NOTE — Telephone Encounter (Signed)
 Copied from CRM [id number]. Topic: Referral - Question >> Aug 28, 2023 10:34 AM Delon DASEN wrote: Reason for CRM: the referred dermatologist is booked for months so he wants one that has sooner appts- [phone]

## 2023-08-29 ENCOUNTER — Other Ambulatory Visit: Payer: Self-pay

## 2023-08-29 DIAGNOSIS — F17200 Nicotine dependence, unspecified, uncomplicated: Secondary | ICD-10-CM

## 2023-08-29 DIAGNOSIS — R739 Hyperglycemia, unspecified: Secondary | ICD-10-CM

## 2023-08-29 DIAGNOSIS — M450 Ankylosing spondylitis of multiple sites in spine: Secondary | ICD-10-CM

## 2023-09-11 ENCOUNTER — Other Ambulatory Visit: Payer: Self-pay | Admitting: Family Medicine

## 2023-09-11 DIAGNOSIS — M450 Ankylosing spondylitis of multiple sites in spine: Secondary | ICD-10-CM

## 2023-09-11 NOTE — Telephone Encounter (Unsigned)
 Copied from CRM #[id number]. Topic: Clinical - Medication Refill >> Sep 11, 2023 10:33 AM Larissa S wrote: Medication: HYDROcodone -acetaminophen  (NORCO) 7.5-325 MG tablet  Has the patient contacted their pharmacy? No (Agent: If no, request that the patient contact the pharmacy for the refill. If patient does not wish to contact the pharmacy document the reason why and proceed with request.) (Agent: If yes, when and what did the pharmacy advise?)  This is the patient's preferred pharmacy:  [facility] #[id number] [place], [place] - [address] AT [facility] OF [facility] [address] & [facility] [address] [place] [address] Phone: [phone] Fax: [phone]  Is this the correct pharmacy for this prescription? Yes If no, delete pharmacy and type the correct one.   Has the prescription been filled recently? No  Is the patient out of the medication? Yes  Has the patient been seen for an appointment in the last year OR does the patient have an upcoming appointment? Yes  Can we respond through MyChart? Yes  Agent: Please be advised that Rx refills may take up to 3 business days. We ask that you follow-up with your pharmacy.

## 2023-09-12 MED ORDER — HYDROCODONE-ACETAMINOPHEN 7.5-325 MG PO TABS: 1.0000 | ORAL_TABLET | Freq: Four times a day (QID) | ORAL | 0 refills | Status: DC | PRN

## 2023-09-12 NOTE — Telephone Encounter (Signed)
 Requested medications are due for refill today.  yes  Requested medications are on the active medications list.  yes  Last refill. 08/14/2023 #120 0 rf  Future visit scheduled.   Yes - next year  Notes to clinic.  Refill not delegated.    Requested Prescriptions  Pending Prescriptions Disp Refills   HYDROcodone -acetaminophen  (NORCO) 7.5-325 MG tablet 120 tablet 0    Sig: Take 1 tablet by mouth every 6 (six) hours as needed.     Not Delegated - Analgesics:  Opioid Agonist Combinations Failed - 09/12/2023  3:12 PM      Failed - This refill cannot be delegated      Failed - Urine Drug Screen completed in last 360 days      Passed - Valid encounter within last 3 months    Recent Outpatient Visits           1 month ago Pre-ulcerative corn or callous   [place] [facility] [name] DASEN, MD   1 year ago Ankylosing spondylitis of multiple sites in spine [facility])   [place] [facility] Pickard, Butler DASEN, MD

## 2023-09-19 ENCOUNTER — Ambulatory Visit: Attending: Physician Assistant | Admitting: Audiologist

## 2023-09-19 DIAGNOSIS — H918X3 Other specified hearing loss, bilateral: Secondary | ICD-10-CM | POA: Insufficient documentation

## 2023-09-19 DIAGNOSIS — H900 Conductive hearing loss, bilateral: Secondary | ICD-10-CM | POA: Diagnosis not present

## 2023-09-19 NOTE — Procedures (Signed)
  Outpatient Audiology and [facility] [address] [place], [place]  [address] [phone]  AUDIOLOGICAL  EVALUATION  NAME: [name]     DOB:   [birth year]      MRN: [id number]                                                                                     DATE: 09/19/2023     REFERENT: [name] DASEN, MD STATUS: Outpatient DIAGNOSIS: Bilateral Asymmetric Conductive Hearing Loss    History: [name] was seen for an audiological evaluation due to sudden onset of hearing loss in the right ear over three months ago. Demitrus suddenly lost hearing in the right ear while sitting on the couch. He has no pain or constant tinnitus in that ear. He assumed it was wax since he has been impacted before. He saw Otolaryngology who removed the wax. His hearing did not improve. He has always had excellent hearing and the sudden change has been jarring. He feels he hears well from the left ear. He has occasional ringing in each ear but its not bothersome.  [name] had a wasp fly in his right ear as a child and it has to be removed. He has a hearing test then. He has Ankylosing spondylitis (HCC) that has caused pain up the right side of his body to the back of his head on the right. He is unsure if its related to the sudden loss of hearing.    Evaluation:  Otoscopy showed a clear view of the tympanic membranes, bilaterally. Minimal cerumen present, not occluding.  Tympanometry results were consistent with flat response in the right ear and shallow negative pressure in the left ear Audiometric testing was completed using Conventional Audiometry techniques with insert earphones and supraural headphones. Test results are consistent with bilateral conductive hearing loss, mild in the left ear and moderate to moderately severe in the right ear. Speech Recognition Thresholds were obtained at  45dB HL in the right ear and at 30dB HL in the left ear. Word Recognition Testing was completed at  40dB  SL and Leory scored 100% in each ear masked. See below.    Results:  The test results were reviewed with Joshua Kramer. See below. Abnormal middle ear function bilaterally. Conductive hearing loss present bilaterally, worse in right ear. Mild sensorineural component to hearing loss at [facility] only. [name] needs to follow up with Otolaryngology. He was advised to save medical treatment conversations for Otolaryngology. There are variable approaches to managing this type of hearing loss all dependent on the cause.  Audiogram printed and provided to [name].      Recommendations: Call [facility] for follow up appointment with Juliane Cohen PA.  Audiology monitoring recommended, audiogram scheduled for 11/21/2023    40 minutes spent testing and counseling on results.   If you have any questions please feel free to contact me at [phone].  Lauraine Ka Stalnaker Au.D.  Audiologist   09/19/2023  8:43 AM  Cc: [name] DASEN, MD

## 2023-09-21 ENCOUNTER — Telehealth: Payer: Self-pay | Admitting: Family Medicine

## 2023-09-21 NOTE — Telephone Encounter (Signed)
 Copied from CRM [id number]. Topic: Referral - Request for Referral >> Aug 28, 2023 10:35 AM Delon DASEN wrote: Did the patient discuss referral with their provider in the last year? Yes (If No - schedule appointment) (If Yes - send message)  Appointment offered? No  Type of order/referral and detailed reason for visit: eye specialist  Preference of office, provider, location: no preference- [place]   If referral order, have you been seen by this specialty before? Yes (If Yes, this issue or another issue? When? Where?  Can we respond through MyChart? Yes

## 2023-09-22 ENCOUNTER — Telehealth: Payer: Self-pay | Admitting: Family Medicine

## 2023-09-22 ENCOUNTER — Other Ambulatory Visit: Payer: Self-pay | Admitting: Family Medicine

## 2023-09-22 DIAGNOSIS — M450 Ankylosing spondylitis of multiple sites in spine: Secondary | ICD-10-CM

## 2023-09-22 MED ORDER — TIZANIDINE HCL 4 MG PO TABS: 4.0000 mg | ORAL_TABLET | Freq: Three times a day (TID) | ORAL | 0 refills | Status: DC

## 2023-09-22 NOTE — Telephone Encounter (Signed)
 Copied from CRM #[id number]. Topic: Clinical - Medication Refill >> Sep 22, 2023 11:07 AM Donna BRAVO wrote: Medication: tiZANidine  (ZANAFLEX ) 4 MG tablet  Has the patient contacted their pharmacy? No Patient calling provider to refill no refills on bottle   This is the patient's preferred pharmacy:    [facility] [address], [place] - [address] [place] [address] Phone: [phone] Fax: [phone]  Is this the correct pharmacy for this prescription? Yes If no, delete pharmacy and type the correct one.   Has the prescription been filled recently? Yes  Is the patient out of the medication? No  Has the patient been seen for an appointment in the last year OR does the patient have an upcoming appointment? Yes  Can we respond through MyChart? Yes  Agent: Please be advised that Rx refills may take up to 3 business days. We ask that you follow-up with your pharmacy.

## 2023-09-22 NOTE — Telephone Encounter (Signed)
 Copied from CRM #[id number]. Topic: Referral - Status >> Sep 22, 2023 11:10 AM Donna BRAVO wrote: Reason for CRM: patient inquiring about referrals: Dermatology: provided Dr [name] Peers dermatology information  Ophthalmology: Dr Octavia is unable to help patient. Patient requesting Retina specialist   Pain management: Dr Wallie Maranda Nakai  information

## 2023-09-22 NOTE — Telephone Encounter (Signed)
 Requested medications are due for refill today.  yes  Requested medications are on the active medications list.  yes  Last refill. 08/24/2023 #90 0 rf  Future visit scheduled.   Yes, next year  Notes to clinic.  Refill not delegated.    Requested Prescriptions  Pending Prescriptions Disp Refills   tiZANidine  (ZANAFLEX ) 4 MG tablet 90 tablet 0    Sig: Take 1 tablet (4 mg total) by mouth 3 (three) times daily.     Not Delegated - Cardiovascular:  Alpha-2 Agonists - tizanidine  Failed - 09/22/2023  2:45 PM      Failed - This refill cannot be delegated      Passed - Valid encounter within last 6 months    Recent Outpatient Visits           1 month ago Pre-ulcerative corn or callous   [place] [facility] [name] DASEN, MD   1 year ago Ankylosing spondylitis of multiple sites in spine [facility])   [place] [facility] Pickard, Butler DASEN, MD

## 2023-09-28 ENCOUNTER — Telehealth: Payer: Self-pay

## 2023-09-28 NOTE — Telephone Encounter (Signed)
 Copied from CRM [id number]. Topic: Referral - Request for Referral >> Aug 28, 2023 10:35 AM Delon DASEN wrote: Did the patient discuss referral with their provider in the last year? Yes (If No - schedule appointment) (If Yes - send message)  Appointment offered? No  Type of order/referral and detailed reason for visit: eye specialist  Preference of office, provider, location: no preference- [place]   If referral order, have you been seen by this specialty before? Yes (If Yes, this issue or another issue? When? Where?  Can we respond through MyChart? Yes >> Sep 15, 2023  1:14 PM Deleta RAMAN wrote: Patient was seen by referral specialist and notified he would not be able to provide care for the patient due to eye circumstances. Please contact the patient regarding setting up for another specialist

## 2023-10-03 DIAGNOSIS — D485 Neoplasm of uncertain behavior of skin: Secondary | ICD-10-CM | POA: Diagnosis not present

## 2023-10-04 ENCOUNTER — Telehealth: Payer: Self-pay

## 2023-10-04 NOTE — Telephone Encounter (Signed)
 Copied from CRM [id number]. Topic: Referral - Status >> Oct 02, 2023 12:54 PM Zebedee SAUNDERS wrote: Reason for CRM: Pt received call regarding referral# [id number], ELNER KUBA A, RDE-RET AND DIAB EYE, Ophthalmology, Fax. [phone], doctor is no longer with [facility] and please send to fax: [phone]. >> Oct 04, 2023 11:42 AM Mia F wrote: Pt states he checked with Dr ELNER office and they still have not received the referral. He is asking if it could please be sent to [phone] as soon as possible. Dr ELNER office cannot schedule

## 2023-10-09 ENCOUNTER — Telehealth: Payer: Self-pay

## 2023-10-09 ENCOUNTER — Telehealth (INDEPENDENT_AMBULATORY_CARE_PROVIDER_SITE_OTHER): Payer: Self-pay | Admitting: Physician Assistant

## 2023-10-09 ENCOUNTER — Other Ambulatory Visit: Payer: Self-pay | Admitting: Family Medicine

## 2023-10-09 DIAGNOSIS — M450 Ankylosing spondylitis of multiple sites in spine: Secondary | ICD-10-CM

## 2023-10-09 NOTE — Telephone Encounter (Signed)
 Copied from CRM [id number]. Topic: Clinical - Medication Refill >> Oct 09, 2023 11:26 AM [name] wrote: Medication: HYDROcodone -acetaminophen  (NORCO) 7.5-325 MG tablet  Has the patient contacted their pharmacy? Yes (Agent: If no, request that the patient contact the pharmacy for the refill. If patient does not wish to contact the pharmacy document the reason why and proceed with request.) (Agent: If yes, when and what did the pharmacy advise?)  This is the patient's preferred pharmacy:  [facility] #[id number] [place], [place] - [address] AT [facility] OF [facility] [address] & [facility] [address] [place] [address] Phone: [phone] Fax: [phone]   Is this the correct pharmacy for this prescription? Yes If no, delete pharmacy and type the correct one.   Has the prescription been filled recently? Yes  Is the patient out of the medication? No  Has the patient been seen for an appointment in the last year OR does the patient have an upcoming appointment? Yes  Can we respond through MyChart? Yes  Agent: Please be advised that Rx refills may take up to 3 business days. We ask that you follow-up with your pharmacy.

## 2023-10-09 NOTE — Telephone Encounter (Signed)
 Copied from CRM [id number]. Topic: Clinical - Medication Refill >> Oct 09, 2023 11:26 AM [name] wrote: Medication: HYDROcodone -acetaminophen  (NORCO) 7.5-325 MG tablet  Has the patient contacted their pharmacy? Yes (Agent: If no, request that the patient contact the pharmacy for the refill. If patient does not wish to contact the pharmacy document the reason why and proceed with request.) (Agent: If yes, when and what did the pharmacy advise?)  This is the patient's preferred pharmacy:  [facility] #[id number] [place], [place] - [address] AT [facility] OF [facility] [address] & [facility] [address] [place] [address] Phone: [phone] Fax: [phone]   Is this the correct pharmacy for this prescription? Yes If no, delete pharmacy and type the correct one.   Has the prescription been filled recently? Yes  Is the patient out of the medication? No  Has the patient been seen for an appointment in the last year OR does the patient have an upcoming appointment? Yes  Can we respond through MyChart?   Agent: Please be advised that Rx refills may take up to 3 business days. We ask that you follow-up with your pharmacy.

## 2023-10-09 NOTE — Telephone Encounter (Signed)
 Attempted to call Joshua Kramer about his audiological results, the plan was for him to follow-up in the office to discuss the results but I do not see a scheduled appointment.  I did leave a voicemail for him to call back.

## 2023-10-09 NOTE — Telephone Encounter (Signed)
 Copied from CRM [id number]. Topic: Referral - Status >> Oct 09, 2023 11:22 AM [name] wrote: Reason for CRM: Referral Status >> Oct 09, 2023 11:25 AM [name] wrote: Patient called, states Joshua Kramer A, RDE-RET AND DIAB EYE, Ophthalmology has yet to receive the requested fax. Patient is urgently requesting that the referral is faxed over to  [phone] and called once completed so that he's able to confirm received.

## 2023-10-10 NOTE — Telephone Encounter (Signed)
 Requested medications are due for refill today.  A little too soon  Requested medications are on the active medications list.  yes  Last refill. 09/12/2023 #120 0 rf  Future visit scheduled.   Next year a wellness visit  Notes to clinic.  Refill /refusal not delegated    Requested Prescriptions  Pending Prescriptions Disp Refills   HYDROcodone -acetaminophen  (NORCO) 7.5-325 MG tablet 120 tablet 0    Sig: Take 1 tablet by mouth every 6 (six) hours as needed.     Not Delegated - Analgesics:  Opioid Agonist Combinations Failed - 10/10/2023 12:27 PM      Failed - This refill cannot be delegated      Failed - Urine Drug Screen completed in last 360 days      Passed - Valid encounter within last 3 months    Recent Outpatient Visits           2 months ago Pre-ulcerative corn or callous   [place] [name] DASEN, MD   1 year ago Ankylosing spondylitis of multiple sites in spine [facility])   [place] [facility] Pickard, Butler DASEN, MD

## 2023-10-11 NOTE — Telephone Encounter (Unsigned)
 Copied from CRM [id number]. Topic: Clinical - Medication Refill >> Oct 11, 2023  4:12 PM Antony RAMAN wrote: Patient called back today asking about his refill of HYDROcodone -acetaminophen  (NORCO) 7.5-325 MG tablet , I informed him we are aware and it can take up to 3 days for the refill to be ready

## 2023-10-12 MED ORDER — HYDROCODONE-ACETAMINOPHEN 7.5-325 MG PO TABS: 1.0000 | ORAL_TABLET | Freq: Four times a day (QID) | ORAL | 0 refills | Status: DC | PRN

## 2023-10-13 ENCOUNTER — Encounter (INDEPENDENT_AMBULATORY_CARE_PROVIDER_SITE_OTHER): Payer: Self-pay | Admitting: Physician Assistant

## 2023-10-13 ENCOUNTER — Telehealth: Payer: Self-pay | Admitting: Pharmacy Technician

## 2023-10-13 ENCOUNTER — Other Ambulatory Visit (HOSPITAL_COMMUNITY): Payer: Self-pay

## 2023-10-13 ENCOUNTER — Ambulatory Visit (INDEPENDENT_AMBULATORY_CARE_PROVIDER_SITE_OTHER): Admitting: Physician Assistant

## 2023-10-13 VITALS — BP 202/76 | HR 52 | Temp 98.2°F

## 2023-10-13 DIAGNOSIS — I10 Essential (primary) hypertension: Secondary | ICD-10-CM

## 2023-10-13 DIAGNOSIS — H900 Conductive hearing loss, bilateral: Secondary | ICD-10-CM | POA: Diagnosis not present

## 2023-10-13 DIAGNOSIS — H9392 Unspecified disorder of left ear: Secondary | ICD-10-CM

## 2023-10-13 MED ORDER — FLUTICASONE PROPIONATE 50 MCG/ACT NA SUSP: 2.0000 | Freq: Every day | NASAL | 6 refills | Status: AC

## 2023-10-13 MED ORDER — LORATADINE 10 MG PO TABS: 10.0000 mg | ORAL_TABLET | Freq: Every day | ORAL | 11 refills | Status: AC

## 2023-10-13 NOTE — Telephone Encounter (Signed)
 Pharmacy Patient Advocate Encounter   Received notification from Onbase that prior authorization for HYDROcodone -Acetaminophen  7.5-325MG  tablets is required/requested.   Insurance verification completed.   The patient is insured through [employer] .   Per test claim: The current 30 day co-pay is, $0.00.  No PA needed at this time. This test claim was processed through [facility]- copay amounts may vary at other pharmacies due to pharmacy/plan contracts, or as the patient moves through the different stages of their insurance plan.

## 2023-10-13 NOTE — Progress Notes (Addendum)
 Dear Dr. Duanne, Here is my assessment for our mutual patient, Joshua Kramer. Thank you for allowing me the opportunity to care for your patient. Please do not hesitate to contact me should you have any other questions. Sincerely, Joshua Cohen PA-C  Otolaryngology Clinic Note Referring provider: Dr. Duanne HPI:  Joshua Kramer is a 68 y.o. male kindly referred by Dr. Duanne   The patient is a 68 year old gentleman seen in our office for follow-up evaluation of decreased hearing.  The patient was last seen in the office on 08/25/2023.  Below is a recap of that encounter.  The patient is a 68 year old gentleman seen in our office for evaluation of cerumen impaction and hearing loss.  The patient notes a significant past medical history of cerumen impaction.  He notes whenever this happens he has decreased hearing.  He would get his ears cleaned and the hearing would come back to normal.  He notes that recently he has had decreased hearing out of the right ear, he saw his primary care provider who attempted irrigation.  He was referred to our office for further removal.  The patient denies any pain in the ears, no trauma to the ears, no history of baseline decreased hearing.  Update 10/13/2023  Since his last office visit he did see audiology on 09/19/2023.  He is evaluation was significant for abnormal middle ear function bilateral, conductive hearing loss bilaterally worse on the right with mild sensorineural component hearing loss at 2000 Hz.  Right sided type B, left-sided type C.  He notes since his last office visit he has denied any changes to his hearing.  He notes continued decreased hearing on the right.  He denies any associated dizziness, no illnesses prior to his hearing loss or throughout this episode.  He notes he is a non-smoker but did smoke up until approximately 1 year ago when he started vaping instead.  He denies any significant history of seasonal allergies.  He notes some minimal tonal  tinnitus bilateral.  No associated pain in the ears.  Additionally the patient notes that he did have a biopsy of a left neck skin lesion.,  This was done 2 weeks ago, awaiting results.  He denies any other head or neck cancer history.   Independent Review of Additional Tests or Records:  Audiological evaluation on 09/19/2023    Evaluation:  Otoscopy showed a clear view of the tympanic membranes, bilaterally. Minimal cerumen present, not occluding.  Tympanometry results were consistent with flat response in the right ear and shallow negative pressure in the left ear Audiometric testing was completed using Conventional Audiometry techniques with insert earphones and supraural headphones. Test results are consistent with bilateral conductive hearing loss, mild in the left ear and moderate to moderately severe in the right ear. Speech Recognition Thresholds were obtained at  45dB HL in the right ear and at 30dB HL in the left ear. Word Recognition Testing was completed at  40dB SL and Joshua Kramer scored 100% in each ear masked. See below.  PMH/Meds/All/SocHx/FamHx/ROS:   Past Medical History:  Diagnosis Date   Ankylosing spondylitis of multiple sites in spine (HCC)    Chest pain 05/2015   a. abnormal nuc stress test with normal cors and LV function on subsequent cath   History of tobacco abuse      Past Surgical History:  Procedure Laterality Date   CARDIAC CATHETERIZATION  2011   nl cors, nl EF   CARDIAC CATHETERIZATION N/A 06/02/2015   Procedure: Left  Heart Cath and Coronary Angiography;  Surgeon: Debby DELENA Sor, MD;  Location: [facility] INVASIVE CV LAB;  Service: Cardiovascular;  Laterality: N/A;    Family History  Problem Relation Age of Onset   Arthritis Mother      Social Connections: Moderately Integrated (04/27/2023)   Social Connection and Isolation Panel    Frequency of Communication with Friends and Family: Three times a week    Frequency of Social Gatherings with Friends and Family:  Once a week    Attends Religious Services: 1 to 4 times per year    Active Member of [employer] or Organizations: No    Attends Engineer, structural: Never    Marital Status: Married      Current Outpatient Medications:    tirzepatide  (MOUNJARO ) 2.5 MG/0.5ML Pen, Inject 2.5 mg into the skin once a week., Disp: 2 mL, Rfl: 0   HYDROcodone -acetaminophen  (NORCO) 7.5-325 MG tablet, Take 1 tablet by mouth every 6 (six) hours as needed., Disp: 120 tablet, Rfl: 0   HYDROcodone -acetaminophen  (NORCO) 7.5-325 MG tablet, Take 1 tablet by mouth every 6 (six) hours as needed., Disp: 120 tablet, Rfl: 0   Meloxicam 10 MG CAPS, Take by mouth. (Patient not taking: Reported on 08/03/2023), Disp: , Rfl:    ofloxacin  (FLOXIN ) 0.3 % OTIC solution, Place 5 drops into the left ear daily., Disp: 5 mL, Rfl: 0   Thiamine HCl (VITAMIN B-1) 250 MG tablet, Take 250 mg by mouth daily., Disp: , Rfl:    tiZANidine  (ZANAFLEX ) 4 MG tablet, Take 1 tablet (4 mg total) by mouth 3 (three) times daily., Disp: 90 tablet, Rfl: 0   vitamin E 180 MG (400 UNITS) capsule, Take 400 Units by mouth daily., Disp: , Rfl:    Physical Exam:   BP (!) 202/76 (BP Location: Left Arm) Comment: first attempt 202/76 second attempt 190/76  Pulse (!) 52   Temp 98.2 F (36.8 C)   SpO2 95%   Pertinent Findings  CN II-XII intact Left external auditory canal with significant cerumen burden, this was removed, there appears to be some redundant tissue within the left external auditory canal noted in the photo below, otherwise TM intact well-pneumatized middle ear space, right EAC clear, TM intact, lightly opaque TM, no obvious fluid levels Anterior rhinoscopy: Septum midline; bilateral inferior turbinates with moderate hypertrophy with some edema No lesions of oral cavity/oropharynx; dentition within normal limits No obviously palpable neck masses/lymphadenopathy/thyromegaly No respiratory distress or stridor    Seprately Identifiable  Procedures:  Procedure Note Pre-procedure diagnosis: Conductive hearing loss right sided type B Post-procedure diagnosis: Same Procedure: Transnasal Fiberoptic Laryngoscopy, CPT 31575 - Mod 25 Indication: see above Complications: None apparent EBL: 0 mL   The procedure was undertaken to further evaluate the patient's complaint of conductive hearing loss right sided type B, with mirror exam inadequate for appropriate examination due to gag reflex and poor patient tolerance   Procedure:  Patient was identified as correct patient. Verbal consent was obtained. The nose was sprayed with oxymetazoline and 4% lidocaine . The The flexible laryngoscope was passed through the nose to view the nasal cavity, pharynx (oropharynx, hypopharynx) and larynx.  The larynx was examined at rest and during multiple phonatory tasks. Documentation was obtained and reviewed with patient. The scope was removed. The patient tolerated the procedure well.   Findings: The nasal cavity and nasopharynx did not reveal any masses or lesions, mucosa appeared to be without obvious lesions. The tongue base, pharyngeal walls, piriform sinuses, vallecula, epiglottis and  postcricoid region are normal in appearance. The visualized portion of the subglottis and proximal trachea is widely patent. The vocal folds are mobile bilaterally.. There are no lesions on the free edge of the vocal folds nor elsewhere in the larynx worrisome for malignancy.   Impression & Plans:  Joshua Kramer is a 68 y.o. male with the following   Hearing loss-  89 year gentleman seen in our office for evaluation of bilateral conductive hearing loss.  He has rather significant conductive hearing loss right greater than left.  Uncertain etiology although suspicion for eustachian tube dysfunction given the right sided type B and the left-sided type C.  This has been ongoing for several months.  On exam today I did note some abnormal tissue in the left external auditory  canal, not suspicious for any malignant source.  I cannot appreciably visualize any fluid levels.  Given the ongoing symptoms I do think a CT temporal bone is warranted given the right sided type B temp to assure no cholesteatoma or abnormal tissue.  Once the CT is available for review I will call him with the results, he will call sooner as needed.  In the meantime he will initiate Flonase , daily Claritin  and saline irrigations.  Patient was also noted to have rather significantly elevated blood pressure at today's visit.  He denies any associated symptoms.  He denies any history of hypertension previously but does note he has been under a lot of stress lately and is having chronic pain.  He is going to reach out to his primary care provider, he was given strict return precautions in relation to this.  He verbalized understanding and agreement to today's plan.   - f/u phone call discussion after CT temporal bone is completed   Thank you for allowing me the opportunity to care for your patient. Please do not hesitate to contact me should you have any other questions.  Sincerely, Joshua Cohen PA-C [facility] Phone: [phone] Fax: [phone]  10/13/2023, 10:01 AM

## 2023-10-13 NOTE — Patient Instructions (Signed)
 I have ordered an imaging study for you to complete prior to your next visit. Please call [facility] Scheduling at [phone] to schedule your imaging if you have not received a call within 24 hours. If you are unable to complete your imaging study prior to your next scheduled visit please call our office to let us  know.

## 2023-10-17 ENCOUNTER — Ambulatory Visit (HOSPITAL_BASED_OUTPATIENT_CLINIC_OR_DEPARTMENT_OTHER)
Admission: RE | Admit: 2023-10-17 | Discharge: 2023-10-17 | Disposition: A | Discharge status: Home / Self Care | Source: Ambulatory Visit | Attending: Physician Assistant | Admitting: Physician Assistant

## 2023-10-17 DIAGNOSIS — H900 Conductive hearing loss, bilateral: Secondary | ICD-10-CM | POA: Insufficient documentation

## 2023-10-18 ENCOUNTER — Ambulatory Visit: Payer: Self-pay

## 2023-10-18 NOTE — Telephone Encounter (Signed)
 FYI Only or Action Required?: FYI only for provider.  Patient was last seen in primary care on 08/03/2023 by [name] DASEN, MD.  Called Nurse Triage reporting Hypertension.  Symptoms began several days ago.  Symptoms are: stable.  Triage Disposition: See PCP When Office is Open (Within 3 Days)  Patient/caregiver understands and will follow disposition?: Yes      Copied from CRM #[id number]. Topic: Clinical - Red Word Triage >> Oct 18, 2023  4:21 PM Harlene ORN wrote: Red Word that prompted transfer to Nurse Triage: high blood pressure  Said his reading was 204 (doesn't remember what the bottom number was)       Reason for Disposition  Systolic BP >= 160 OR Diastolic >= 100  Answer Assessment - Initial Assessment Questions Blood pressure elevated recently at an appointment. No symptoms. Appointment scheduled for 10/3. Patient instructed to call back for new or worsening symptoms. Patient verbalized understanding and agreement with this plan.     1. BLOOD PRESSURE: What is your blood pressure? Did you take at least two measurements 5 minutes apart?     202/76 2. ONSET: When did you take your blood pressure?     3-4 3. HOW: How did you take your blood pressure? (e.g., automatic home BP monitor, visiting nurse)     At a doctors appointment  4. HISTORY: Do you have a history of high blood pressure?     No 5. MEDICINES: Are you taking any medicines for blood pressure? Have you missed any doses recently?     No 6. OTHER SYMPTOMS: Do you have any symptoms? (e.g., blurred vision, chest pain, difficulty breathing, headache, weakness)     No symptoms  Protocols used: Blood Pressure - High-A-AH

## 2023-10-19 ENCOUNTER — Telehealth: Payer: Self-pay | Admitting: Family Medicine

## 2023-10-19 NOTE — Telephone Encounter (Unsigned)
 Copied from CRM [id number]. Topic: Clinical - Medication Refill >> Oct 19, 2023 12:25 PM Amy B wrote: Medication:  tiZANidine  (ZANAFLEX ) 4 MG table   Has the patient contacted their pharmacy? No (Agent: If no, request that the patient contact the pharmacy for the refill. If patient does not wish to contact the pharmacy document the reason why and proceed with request.) (Agent: If yes, when and what did the pharmacy advise?)  This is the patient's preferred pharmacy:   [facility] [address] [place] - [address] [place] [address] Phone: [phone] Fax: [phone]  Is this the correct pharmacy for this prescription? Yes If no, delete pharmacy and type the correct one.   Has the prescription been filled recently? No  Is the patient out of the medication? No  Has the patient been seen for an appointment in the last year OR does the patient have an upcoming appointment? Yes  Can we respond through MyChart? Yes  Agent: Please be advised that Rx refills may take up to 3 business days. We ask that you follow-up with your pharmacy.

## 2023-10-20 ENCOUNTER — Ambulatory Visit (INDEPENDENT_AMBULATORY_CARE_PROVIDER_SITE_OTHER): Admitting: Family Medicine

## 2023-10-20 ENCOUNTER — Encounter: Payer: Self-pay | Admitting: Family Medicine

## 2023-10-20 VITALS — BP 180/82 | HR 51 | Temp 98.5°F | Ht 73.0 in | Wt 278.0 lb

## 2023-10-20 DIAGNOSIS — I10 Essential (primary) hypertension: Secondary | ICD-10-CM | POA: Diagnosis not present

## 2023-10-20 MED ORDER — BUSPIRONE HCL 7.5 MG PO TABS: 7.5000 mg | ORAL_TABLET | Freq: Two times a day (BID) | ORAL | 0 refills | Status: DC

## 2023-10-20 MED ORDER — VALSARTAN 160 MG PO TABS: 160.0000 mg | ORAL_TABLET | Freq: Every day | ORAL | 3 refills | Status: AC

## 2023-10-20 NOTE — Progress Notes (Signed)
 Subjective:    Patient ID: Joshua Kramer, male    DOB: 1955/11/14, 68 y.o.   MRN: [id number] Patient's blood pressure has been extremely high recently.  I repeated his blood pressure today and found to be 180/82.  He denies any chest pain shortness of breath or dyspnea on exertion.  He denies any recent changes in his diet.  He does report feeling under tremendous stress and anxious.  He would like to take something for anxiety.  He also complains of swelling in both legs distal to his knees but he denies any dyspnea on exertion orthopnea. Past Medical History:  Diagnosis Date   Ankylosing spondylitis of multiple sites in spine (HCC)    Chest pain 05/2015   a. abnormal nuc stress test with normal cors and LV function on subsequent cath   History of tobacco abuse    Past Surgical History:  Procedure Laterality Date   CARDIAC CATHETERIZATION  2011   nl cors, nl EF   CARDIAC CATHETERIZATION N/A 06/02/2015   Procedure: Left Heart Cath and Coronary Angiography;  Surgeon: Debby DELENA Sor, MD;  Location: [facility] INVASIVE CV LAB;  Service: Cardiovascular;  Laterality: N/A;   Current Outpatient Medications on File Prior to Visit  Medication Sig Dispense Refill   fluticasone  (FLONASE ) 50 MCG/ACT nasal spray Place 2 sprays into both nostrils daily. 16 g 6   HYDROcodone -acetaminophen  (NORCO) 7.5-325 MG tablet Take 1 tablet by mouth every 6 (six) hours as needed. 120 tablet 0   HYDROcodone -acetaminophen  (NORCO) 7.5-325 MG tablet Take 1 tablet by mouth every 6 (six) hours as needed. 120 tablet 0   loratadine  (CLARITIN ) 10 MG tablet Take 1 tablet (10 mg total) by mouth daily. 30 tablet 11   ofloxacin  (FLOXIN ) 0.3 % OTIC solution Place 5 drops into the left ear daily. 5 mL 0   Thiamine HCl (VITAMIN B-1) 250 MG tablet Take 250 mg by mouth daily.     tirzepatide  (MOUNJARO ) 2.5 MG/0.5ML Pen Inject 2.5 mg into the skin once a week. 2 mL 0   tiZANidine  (ZANAFLEX ) 4 MG tablet Take 1 tablet (4 mg total) by mouth 3  (three) times daily. 90 tablet 0   vitamin E 180 MG (400 UNITS) capsule Take 400 Units by mouth daily.     Meloxicam 10 MG CAPS Take by mouth. (Patient not taking: Reported on 10/20/2023)     No current facility-administered medications on file prior to visit.   No Known Allergies Social History   Socioeconomic History   Marital status: Married    Spouse name: Not on file   Number of children: 1   Years of education: Not on file   Highest education level: Not on file  Occupational History   Occupation: Sales  Tobacco Use   Smoking status: Former    Current packs/day: 0.00    Types: Cigarettes    Quit date: 06/18/2014    Years since quitting: 9.3   Smokeless tobacco: Never  Substance and Sexual Activity   Alcohol use: No    Alcohol/week: 0.0 standard drinks of alcohol   Drug use: No   Sexual activity: Yes  Other Topics Concern   Not on file  Social History Narrative   Previously was a marathon runner    Lives with wife; 1 daughter    Social Drivers of Corporate investment banker Strain: Medium Risk (04/27/2023)   Overall Financial Resource Strain (CARDIA)    Difficulty of Paying Living Expenses: Somewhat hard  Food  Insecurity: No Food Insecurity (04/27/2023)   Hunger Vital Sign    Worried About Running Out of Food in the Last Year: Never true    Ran Out of Food in the Last Year: Never true  Transportation Needs: No Transportation Needs (04/27/2023)   PRAPARE - Administrator, Civil Service (Medical): No    Lack of Transportation (Non-Medical): No  Recent Concern: Transportation Needs - Unmet Transportation Needs (03/09/2023)   PRAPARE - Transportation    Lack of Transportation (Medical): Yes    Lack of Transportation (Non-Medical): Yes  Physical Activity: Inactive (04/27/2023)   Exercise Vital Sign    Days of Exercise per Week: 0 days    Minutes of Exercise per Session: 0 min  Stress: Stress Concern Present (04/27/2023)   [employer] of Occupational  Health - Occupational Stress Questionnaire    Feeling of Stress : To some extent  Social Connections: Moderately Integrated (04/27/2023)   Social Connection and Isolation Panel    Frequency of Communication with Friends and Family: Three times a week    Frequency of Social Gatherings with Friends and Family: Once a week    Attends Religious Services: 1 to 4 times per year    Active Member of [employer] or Organizations: No    Attends Banker Meetings: Never    Marital Status: Married  Catering manager Violence: Not At Risk (04/27/2023)   Humiliation, Afraid, Rape, and Kick questionnaire    Fear of Current or Ex-Partner: No    Emotionally Abused: No    Physically Abused: No    Sexually Abused: No      Review of Systems  All other systems reviewed and are negative.      Objective:   Physical Exam Vitals reviewed.  Constitutional:      General: He is not in acute distress.    Appearance: Normal appearance. He is not ill-appearing or toxic-appearing.  Cardiovascular:     Rate and Rhythm: Normal rate and regular rhythm.     Pulses:          Dorsalis pedis pulses are 2+ on the right side and 2+ on the left side.     Heart sounds: Normal heart sounds. No murmur heard. Pulmonary:     Effort: Pulmonary effort is normal. No respiratory distress.     Breath sounds: Normal breath sounds. No stridor. No wheezing, rhonchi or rales.  Musculoskeletal:     Cervical back: Rigidity and tenderness present. Decreased range of motion.     Thoracic back: Tenderness and bony tenderness present. Decreased range of motion.     Lumbar back: Tenderness and bony tenderness present. Decreased range of motion.     Right lower leg: Edema present.     Left lower leg: Edema present.     Right ankle: Swelling present. Decreased range of motion.     Left ankle: Swelling present. Decreased range of motion.     Right foot: Bony tenderness present.     Left foot: Bony tenderness present.  Feet:      Right foot:     Toenail Condition: Right toenails are abnormally thick and long.     Left foot:     Toenail Condition: Left toenails are abnormally thick and long.  Neurological:     Mental Status: He is alert.     Assessment & Plan:  Benign essential HTN Blood pressure is elevated.  Add valsartan 160 mg a day and recheck blood pressure and BMP  in 1 week.  Likely add hydrochlorothiazide at that appointment if blood pressure remains elevated beyond 140/90

## 2023-10-24 ENCOUNTER — Ambulatory Visit: Admitting: Student in an Organized Health Care Education/Training Program

## 2023-10-26 DIAGNOSIS — M453 Ankylosing spondylitis of cervicothoracic region: Secondary | ICD-10-CM | POA: Diagnosis not present

## 2023-10-26 DIAGNOSIS — H2012 Chronic iridocyclitis, left eye: Secondary | ICD-10-CM | POA: Diagnosis not present

## 2023-10-26 DIAGNOSIS — H2511 Age-related nuclear cataract, right eye: Secondary | ICD-10-CM | POA: Diagnosis not present

## 2023-10-26 DIAGNOSIS — H26492 Other secondary cataract, left eye: Secondary | ICD-10-CM | POA: Diagnosis not present

## 2023-10-27 ENCOUNTER — Ambulatory Visit: Admitting: Family Medicine

## 2023-10-27 ENCOUNTER — Encounter: Payer: Self-pay | Admitting: Family Medicine

## 2023-10-27 VITALS — BP 146/74 | HR 47 | Temp 97.8°F | Ht 73.0 in | Wt 277.2 lb

## 2023-10-27 DIAGNOSIS — I10 Essential (primary) hypertension: Secondary | ICD-10-CM | POA: Diagnosis not present

## 2023-10-27 MED ORDER — HYDROCHLOROTHIAZIDE 25 MG PO TABS: 25.0000 mg | ORAL_TABLET | Freq: Every day | ORAL | 3 refills | Status: AC

## 2023-10-27 NOTE — Progress Notes (Signed)
 Subjective:    Patient ID: Joshua Kramer, male    DOB: Sep 16, 1955, 68 y.o.   MRN: [id number] 10/20/23 Patient's blood pressure has been extremely high recently.  I repeated his blood pressure today and found to be 180/82.  He denies any chest pain shortness of breath or dyspnea on exertion.  He denies any recent changes in his diet.  He does report feeling under tremendous stress and anxious.  He would like to take something for anxiety.  He also complains of swelling in both legs distal to his knees but he denies any dyspnea on exertion orthopnea.  At that time, my plan was: Blood pressure is elevated.  Add valsartan 160 mg a day and recheck blood pressure and BMP in 1 week.  Likely add hydrochlorothiazide at that appointment if blood pressure remains elevated beyond 140/90  10/27/23 Blood pressure today is much better although still elevated.  He denies any side effects from valsartan.  Specifically he denies any angioedema. Past Medical History:  Diagnosis Date  . Ankylosing spondylitis of multiple sites in spine (HCC)   . Chest pain 05/2015   a. abnormal nuc stress test with normal cors and LV function on subsequent cath  . History of tobacco abuse    Past Surgical History:  Procedure Laterality Date  . CARDIAC CATHETERIZATION  2011   nl cors, nl EF  . CARDIAC CATHETERIZATION N/A 06/02/2015   Procedure: Left Heart Cath and Coronary Angiography;  Surgeon: Debby DELENA Sor, MD;  Location: [facility] INVASIVE CV LAB;  Service: Cardiovascular;  Laterality: N/A;   Current Outpatient Medications on File Prior to Visit  Medication Sig Dispense Refill  . busPIRone (BUSPAR) 7.5 MG tablet Take 1 tablet (7.5 mg total) by mouth 2 (two) times daily. 60 tablet 0  . fluticasone  (FLONASE ) 50 MCG/ACT nasal spray Place 2 sprays into both nostrils daily. 16 g 6  . HYDROcodone -acetaminophen  (NORCO) 7.5-325 MG tablet Take 1 tablet by mouth every 6 (six) hours as needed. 120 tablet 0  . HYDROcodone -acetaminophen   (NORCO) 7.5-325 MG tablet Take 1 tablet by mouth every 6 (six) hours as needed. 120 tablet 0  . loratadine  (CLARITIN ) 10 MG tablet Take 1 tablet (10 mg total) by mouth daily. 30 tablet 11  . Meloxicam 10 MG CAPS Take by mouth. (Patient not taking: Reported on 10/20/2023)    . ofloxacin  (FLOXIN ) 0.3 % OTIC solution Place 5 drops into the left ear daily. 5 mL 0  . Thiamine HCl (VITAMIN B-1) 250 MG tablet Take 250 mg by mouth daily.    . tirzepatide  (MOUNJARO ) 2.5 MG/0.5ML Pen Inject 2.5 mg into the skin once a week. 2 mL 0  . tiZANidine  (ZANAFLEX ) 4 MG tablet Take 1 tablet (4 mg total) by mouth 3 (three) times daily. 90 tablet 0  . valsartan (DIOVAN) 160 MG tablet Take 1 tablet (160 mg total) by mouth daily. 90 tablet 3  . vitamin E 180 MG (400 UNITS) capsule Take 400 Units by mouth daily.     No current facility-administered medications on file prior to visit.   No Known Allergies Social History   Socioeconomic History  . Marital status: Married    Spouse name: Not on file  . Number of children: 1  . Years of education: Not on file  . Highest education level: Not on file  Occupational History  . Occupation: Sales  Tobacco Use  . Smoking status: Former    Current packs/day: 0.00    Types: Cigarettes  Quit date: 06/18/2014    Years since quitting: 9.3  . Smokeless tobacco: Never  Substance and Sexual Activity  . Alcohol use: No    Alcohol/week: 0.0 standard drinks of alcohol  . Drug use: No  . Sexual activity: Yes  Other Topics Concern  . Not on file  Social History Narrative   Previously was a marathon runner    Lives with wife; 1 daughter    Social Drivers of Corporate investment banker Strain: Medium Risk (04/27/2023)   Overall Financial Resource Strain (CARDIA)   . Difficulty of Paying Living Expenses: Somewhat hard  Food Insecurity: No Food Insecurity (04/27/2023)   Hunger Vital Sign   . Worried About Programme researcher, broadcasting/film/video in the Last Year: Never true   . Ran Out of Food  in the Last Year: Never true  Transportation Needs: No Transportation Needs (04/27/2023)   PRAPARE - Transportation   . Lack of Transportation (Medical): No   . Lack of Transportation (Non-Medical): No  Recent Concern: Transportation Needs - Unmet Transportation Needs (03/09/2023)   PRAPARE - Transportation   . Lack of Transportation (Medical): Yes   . Lack of Transportation (Non-Medical): Yes  Physical Activity: Inactive (04/27/2023)   Exercise Vital Sign   . Days of Exercise per Week: 0 days   . Minutes of Exercise per Session: 0 min  Stress: Stress Concern Present (04/27/2023)   [employer] of Occupational Health - Occupational Stress Questionnaire   . Feeling of Stress : To some extent  Social Connections: Moderately Integrated (04/27/2023)   Social Connection and Isolation Panel   . Frequency of Communication with Friends and Family: Three times a week   . Frequency of Social Gatherings with Friends and Family: Once a week   . Attends Religious Services: 1 to 4 times per year   . Active Member of Clubs or Organizations: No   . Attends Banker Meetings: Never   . Marital Status: Married  Catering manager Violence: Not At Risk (04/27/2023)   Humiliation, Afraid, Rape, and Kick questionnaire   . Fear of Current or Ex-Partner: No   . Emotionally Abused: No   . Physically Abused: No   . Sexually Abused: No      Review of Systems  All other systems reviewed and are negative.      Objective:   Physical Exam Vitals reviewed.  Constitutional:      General: He is not in acute distress.    Appearance: Normal appearance. He is not ill-appearing or toxic-appearing.  Cardiovascular:     Rate and Rhythm: Normal rate and regular rhythm.     Heart sounds: Normal heart sounds. No murmur heard. Pulmonary:     Effort: Pulmonary effort is normal. No respiratory distress.     Breath sounds: Normal breath sounds. No stridor. No wheezing, rhonchi or rales.   Musculoskeletal:     Cervical back: Rigidity and tenderness present. Decreased range of motion.     Thoracic back: Tenderness and bony tenderness present. Decreased range of motion.     Lumbar back: Tenderness and bony tenderness present. Decreased range of motion.     Right lower leg: Edema present.     Left lower leg: Edema present.     Right ankle: Swelling present. Decreased range of motion.     Left ankle: Swelling present. Decreased range of motion.     Right foot: Bony tenderness present.     Left foot: Bony tenderness present.  Feet:  Right foot:     Toenail Condition: Right toenails are abnormally thick and long.     Left foot:     Toenail Condition: Left toenails are abnormally thick and long.  Neurological:     Mental Status: He is alert.     Assessment & Plan:  Benign essential HTN - Plan: Basic Metabolic Panel Without GFR Recheck renal function.  Continue valsartan and supplement with hydrochlorothiazide 25 mg daily.  Recheck blood pressure in 1 week

## 2023-10-28 LAB — BASIC METABOLIC PANEL WITHOUT GFR
BUN: 15 mg/dL (ref 7–25)
CO2: 30 mmol/L (ref 20–32)
Calcium: 9.2 mg/dL (ref 8.6–10.3)
Chloride: 103 mmol/L (ref 98–110)
Creat: 0.75 mg/dL (ref 0.70–1.35)
Glucose, Bld: 120 mg/dL — ABNORMAL HIGH (ref 65–99)
Potassium: 4.3 mmol/L (ref 3.5–5.3)
Sodium: 140 mmol/L (ref 135–146)

## 2023-10-30 ENCOUNTER — Ambulatory Visit: Payer: Self-pay | Admitting: Family Medicine

## 2023-10-31 ENCOUNTER — Telehealth: Payer: Self-pay | Admitting: Family Medicine

## 2023-10-31 ENCOUNTER — Other Ambulatory Visit: Payer: Self-pay | Admitting: Family Medicine

## 2023-10-31 DIAGNOSIS — M450 Ankylosing spondylitis of multiple sites in spine: Secondary | ICD-10-CM

## 2023-10-31 MED ORDER — TIZANIDINE HCL 4 MG PO TABS: 4.0000 mg | ORAL_TABLET | Freq: Three times a day (TID) | ORAL | 0 refills | Status: DC

## 2023-10-31 NOTE — Telephone Encounter (Signed)
 Copied from CRM [id number]. Topic: Referral - Question >> Sep 15, 2023  1:15 PM Deleta RAMAN wrote: Reason for CRM: patient would like to know any update regarding pain management referral and dermatologist as well. Message also sent regarding eyes specialist referral. Please contact patient [phone]

## 2023-10-31 NOTE — Telephone Encounter (Signed)
 Copied from CRM [id number]. Topic: Clinical - Prescription Issue >> Oct 31, 2023 12:10 PM Sophia H wrote: Reason for CRM:   Patient states he is still waiting on refill for tiZANidine  (ZANAFLEX ) 4 MG table. Looks like note was unassigned, patient was in on 10/10 with Dr. Duanne. Patient requested refill of this medication on 10/02.    [facility] [address] [place] - [address]

## 2023-11-09 ENCOUNTER — Other Ambulatory Visit: Payer: Self-pay | Admitting: Family Medicine

## 2023-11-09 DIAGNOSIS — M450 Ankylosing spondylitis of multiple sites in spine: Secondary | ICD-10-CM

## 2023-11-09 NOTE — Telephone Encounter (Unsigned)
 Copied from CRM [id number]. Topic: Clinical - Medication Refill >> Nov 09, 2023 11:19 AM Gustabo D wrote: Medication: HYDROcodone -acetaminophen  (NORCO) 7.5-325 MG tablet  Has the patient contacted their pharmacy? No (Agent: If no, request that the patient contact the pharmacy for the refill. If patient does not wish to contact the pharmacy document the reason why and proceed with request.) (Agent: If yes, when and what did the pharmacy advise?)  This is the patient's preferred pharmacy:  [id number] [place], [place] - [address] AT [facility] OF [facility] [address] & [facility] [address] [place] [address] Phone: [phone] Fax: [phone]  [facility] [address] [place] - [address] [place] [address] Phone: [phone] Fax: [phone]  Is this the correct pharmacy for this prescription? Yes If no, delete pharmacy and type the correct one.   Has the prescription been filled recently? No  Is the patient out of the medication?not yet almost  Has the patient been seen for an appointment in the last year OR does the patient have an upcoming appointment? Yes  Can we respond through MyChart? Yes  Agent: Please be advised that Rx refills may take up to 3 business days. We ask that you follow-up with your pharmacy.

## 2023-11-10 ENCOUNTER — Telehealth: Payer: Self-pay | Admitting: Family Medicine

## 2023-11-10 ENCOUNTER — Other Ambulatory Visit: Payer: Self-pay | Admitting: Family Medicine

## 2023-11-10 DIAGNOSIS — M450 Ankylosing spondylitis of multiple sites in spine: Secondary | ICD-10-CM

## 2023-11-10 MED ORDER — HYDROCODONE-ACETAMINOPHEN 7.5-325 MG PO TABS: 1.0000 | ORAL_TABLET | Freq: Four times a day (QID) | ORAL | 0 refills | Status: DC | PRN

## 2023-11-10 NOTE — Telephone Encounter (Signed)
 Copied from CRM [id number]. Topic: Clinical - Medication Refill >> Nov 09, 2023 11:19 AM Gustabo D wrote: Medication: HYDROcodone -acetaminophen  (NORCO) 7.5-325 MG tablet  Has the patient contacted their pharmacy? No (Agent: If no, request that the patient contact the pharmacy for the refill. If patient does not wish to contact the pharmacy document the reason why and proceed with request.) (Agent: If yes, when and what did the pharmacy advise?)  This is the patient's preferred pharmacy:  [id number] [place], [place] - [address] AT [facility] OF [facility] [address] & [facility] [address] [place] [address] Phone: [phone] Fax: [phone]  [facility] [address] [place] - [address] [place] [address] Phone: [phone] Fax: [phone]  Is this the correct pharmacy for this prescription? Yes If no, delete pharmacy and type the correct one.   Has the prescription been filled recently? No  Is the patient out of the medication?not yet almost  Has the patient been seen for an appointment in the last year OR does the patient have an upcoming appointment? Yes  Can we respond through MyChart? Yes  Agent: Please be advised that Rx refills may take up to 3 business days. We ask that you follow-up with your pharmacy. >> Nov 10, 2023  3:04 PM Amy B wrote: Patient states the pharmacy still has not received refill request.  Please advise.

## 2023-11-10 NOTE — Telephone Encounter (Signed)
 Requested medication (s) are due for refill today: yes  Requested medication (s) are on the active medication list: yes  Last refill:  09/12/23  Future visit scheduled: yes  Notes to clinic:  Unable to refill per protocol, cannot delegate.      Requested Prescriptions  Pending Prescriptions Disp Refills   HYDROcodone -acetaminophen  (NORCO) 7.5-325 MG tablet 120 tablet 0    Sig: Take 1 tablet by mouth every 6 (six) hours as needed.     Not Delegated - Analgesics:  Opioid Agonist Combinations Failed - 11/10/2023  3:39 PM      Failed - This refill cannot be delegated      Failed - Urine Drug Screen completed in last 360 days      Passed - Valid encounter within last 3 months    Recent Outpatient Visits           2 weeks ago Benign essential HTN   [place] [facility] [name] DASEN, MD   3 weeks ago Benign essential HTN   [place] [facility] Duanne, Butler DASEN, MD   3 months ago Pre-ulcerative corn or callous   [place] [facility] [name] DASEN, MD   1 year ago Ankylosing spondylitis of multiple sites in spine [facility])   [place] [facility] Pickard, Butler DASEN, MD

## 2023-11-21 ENCOUNTER — Ambulatory Visit: Attending: Physician Assistant | Admitting: Audiologist

## 2023-11-21 ENCOUNTER — Telehealth (INDEPENDENT_AMBULATORY_CARE_PROVIDER_SITE_OTHER): Payer: Self-pay | Admitting: Physician Assistant

## 2023-11-21 DIAGNOSIS — H918X3 Other specified hearing loss, bilateral: Secondary | ICD-10-CM | POA: Diagnosis present

## 2023-11-21 DIAGNOSIS — H900 Conductive hearing loss, bilateral: Secondary | ICD-10-CM | POA: Diagnosis present

## 2023-11-21 NOTE — Telephone Encounter (Signed)
 The patient had a hearing test done today elsewhere.  He saw Lauraine today and gave him the results.  He wants to discuss a possible ear tube procedure before scheduling with Chyrl, he says the medications given did not help his ear at all.

## 2023-11-21 NOTE — Procedures (Signed)
  Outpatient Audiology and [facility] [address] [place], [place]  [address] [phone]  AUDIOLOGICAL  EVALUATION  NAME: Joshua Kramer     DOB:   October 10, 1955      MRN: [id number]                                                                                     DATE: 11/21/2023     REFERENT: [name] DASEN, MD STATUS: Outpatient DIAGNOSIS: Conductive Hearing Loss Bilateral    History: [name] was seen for an audiological evaluation to monitor hearing loss. [name] has bilateral conductive hearing loss. He is under care of Otolaryngology office PA [facility]. [name] was told he needs tubes. He is ready for this procedure and was under the impression that is what was scheduled today. He notes the left ear if hearing better. The right ear still has significant loss. He had a CT scan and was told he has fluid and bone fragments in right ear. He fell off a curb a while ago, and hit his forehead hard. He wonders if this could be related.    Evaluation:  Otoscopy showed a slight view of the tympanic membranes, bilaterally Tympanometry results were consistent with flat response in the right ear with some possible shallow movement, left ear still showing type C negative pressure, see below Audiometric testing was completed using Conventional Audiometry techniques with insert earphones and supraural headphones. Test results are consistent with slight conductive hearing loss in the left ear and mild to moderately severe conductive loss in right ear. Hearing has improved bilaterally  since his last test 09/19/2023. Speech Recognition Thresholds were obtained at  45dB HL in the right ear and at 20dB HL in the left ear. Word Recognition Testing was completed at  40dB SL and [name] scored 100% in each ear.    Results:  The test results were reviewed with [name]. His hearing has improved but he still has abnormal middle ear function bilaterally  with conductive hearing loss, more in  right ear. He needs to call the Otolaryngology office to schedule follow up.  Audiograms printed and provided to [facility].     Recommendations: Recommend follow up with Otolaryngology. Plan of care discussed with [name]. [name] will reach out to their office to schedule follow up due to continued conductive loss and abnormal middle ear function.     35 minutes spent testing and counseling on results.   If you have any questions please feel free to contact me at [phone].  Lauraine Ka Stalnaker Au.D.  Audiologist   11/21/2023  8:34 AM  Cc: [name] DASEN, MD

## 2023-11-21 NOTE — Telephone Encounter (Signed)
 Can we schedule him with Dr. Roark for ear tubes, thanks

## 2023-11-22 ENCOUNTER — Other Ambulatory Visit: Payer: Self-pay | Admitting: Family Medicine

## 2023-11-28 ENCOUNTER — Ambulatory Visit (INDEPENDENT_AMBULATORY_CARE_PROVIDER_SITE_OTHER): Admitting: Otolaryngology

## 2023-11-28 ENCOUNTER — Encounter (INDEPENDENT_AMBULATORY_CARE_PROVIDER_SITE_OTHER): Payer: Self-pay | Admitting: Otolaryngology

## 2023-11-28 VITALS — BP 146/78 | HR 75 | Ht 73.0 in | Wt 240.0 lb

## 2023-11-28 DIAGNOSIS — H900 Conductive hearing loss, bilateral: Secondary | ICD-10-CM

## 2023-11-28 DIAGNOSIS — H6991 Unspecified Eustachian tube disorder, right ear: Secondary | ICD-10-CM

## 2023-11-28 DIAGNOSIS — H6993 Unspecified Eustachian tube disorder, bilateral: Secondary | ICD-10-CM

## 2023-11-28 NOTE — Progress Notes (Signed)
 Reason for Consult:etd Referring Physician: Dr [name] is an 68 y.o. male.  HPI: hx of etd and has had workup by Chyrl Cohen.  He has had an endoscopy of his nasopharynx which was clear.  He has a temporal bone CT scan which does not show any evidence of erosion.  He has effusion bilaterally but the right is much worse.  He is here for tympanostomy tube placement  Past Medical History:  Diagnosis Date   Ankylosing spondylitis of multiple sites in spine (HCC)    Chest pain 05/2015   a. abnormal nuc stress test with normal cors and LV function on subsequent cath   History of tobacco abuse     Past Surgical History:  Procedure Laterality Date   CARDIAC CATHETERIZATION  2011   nl cors, nl EF   CARDIAC CATHETERIZATION N/A 06/02/2015   Procedure: Left Heart Cath and Coronary Angiography;  Surgeon: Debby DELENA Sor, MD;  Location: [facility] INVASIVE CV LAB;  Service: Cardiovascular;  Laterality: N/A;    Family History  Problem Relation Age of Onset   Arthritis Mother     Social History:  reports that he quit smoking about 9 years ago. His smoking use included cigarettes. He has never used smokeless tobacco. He reports that he does not drink alcohol and does not use drugs.  Allergies: No Known Allergies  Medications: I have reviewed the patient's current medications.  No results found for this or any previous visit (from the past 48 hours).  No results found.  ROS Blood pressure (!) 146/78, pulse 75, height 6' 1 (1.854 m), weight 240 lb (108.9 kg), SpO2 93%. Physical Exam Constitutional:      Appearance: Normal appearance.  HENT:     Head: Normocephalic and atraumatic.     Right Ear: See below    Nose: Nose normal. Turbinates with mild hypertrophy, No significant swelling or masses.     Oral cavity/oropharynx: Mucous membranes are moist. No lesions or masses    Larynx: normal voice. Mirror attempted without success    Eyes:     Extraocular Movements: Extraocular movements  intact.     Conjunctiva/sclera: Conjunctivae normal.     Pupils: Pupils are equal, round, and reactive to light.  Cardiovascular:     Rate and Rhythm: Normal rate.  Pulmonary:     Effort: Pulmonary effort is normal.  Musculoskeletal:     Cervical back: Normal range of motion and neck supple. No rigidity.  Lymphadenopathy:     Cervical: No cervical adenopathy or masses.salivary glands without lesions. .  Neurological:     Mental Status: He is alert. CN 2-12 intact. No nystagmus  Myringotomy and tube procedure  Patient was informed the risk and benefits of the procedure and options were discussed all questions were answered and consent was obtained.  The patient was seated in the chair with a otomicroscope direction injection of the anterior and posterior canal wall.  1% lidocaine  with 1-100,000 epinephrine approximately a half a cc was injected.  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  The middle ear was suctioned of serous effusion.  A PE tube was placed without difficulty.  The patient tolerated the procedure well.  The left ear does not have the obvious effusion apparent that the right ear did.  We held off on proceeding with the left ear.  There  was no bleeding.     Assessment/Plan: Eustachian tube dysfunction-his right ear had obvious effusion that was suctioned down and his hearing was immediately better.  The tube was placed without difficulty.  He is going to see how he likes the tube in the right ear and then follow-up in about a month to discuss further the left ear needs a tube as well.  He is already had a examination of the nasopharynx and his CT of the temporal bone was without obvious erosion.  He will follow-up in 6 months if he does not feel like the left tube is necessary.  [name] Notice 11/28/2023, 10:03 AM

## 2023-11-29 ENCOUNTER — Other Ambulatory Visit: Payer: Self-pay | Admitting: Family Medicine

## 2023-11-29 DIAGNOSIS — M450 Ankylosing spondylitis of multiple sites in spine: Secondary | ICD-10-CM

## 2023-12-08 ENCOUNTER — Other Ambulatory Visit: Payer: Self-pay | Admitting: Family Medicine

## 2023-12-08 DIAGNOSIS — M450 Ankylosing spondylitis of multiple sites in spine: Secondary | ICD-10-CM

## 2023-12-08 NOTE — Telephone Encounter (Unsigned)
 Copied from CRM #[id number]. Topic: Clinical - Medication Refill >> Dec 08, 2023  1:57 PM Shardie S wrote: Medication: HYDROcodone -acetaminophen  (NORCO) 7.5-325 MG tablet  Has the patient contacted their pharmacy? Yes (Agent: If no, request that the patient contact the pharmacy for the refill. If patient does not wish to contact the pharmacy document the reason why and proceed with request.) (Agent: If yes, when and what did the pharmacy advise?)  This is the patient's preferred pharmacy:  [id number] [place],  - [address] AT [facility] OF [facility] [address] & [facility] [address] [place] [address] Phone: [phone] Fax: [phone]   Is this the correct pharmacy for this prescription? Yes If no, delete pharmacy and type the correct one.   Has the prescription been filled recently? No  Is the patient out of the medication? Yes  Has the patient been seen for an appointment in the last year OR does the patient have an upcoming appointment? Yes  Can we respond through MyChart? Yes  Agent: Please be advised that Rx refills may take up to 3 business days. We ask that you follow-up with your pharmacy.

## 2023-12-09 NOTE — Telephone Encounter (Signed)
 Requested medication (s) are due for refill today: Yes  Requested medication (s) are on the active medication list: Yes  Last refill:  11/10/23  Future visit scheduled: No  Notes to clinic:  Unable to refill per protocol, cannot delegate.      Requested Prescriptions  Pending Prescriptions Disp Refills   HYDROcodone -acetaminophen  (NORCO) 7.5-325 MG tablet 120 tablet 0    Sig: Take 1 tablet by mouth every 6 (six) hours as needed.     Not Delegated - Analgesics:  Opioid Agonist Combinations Failed - 12/09/2023  9:31 AM      Failed - This refill cannot be delegated      Failed - Urine Drug Screen completed in last 360 days      Passed - Valid encounter within last 3 months    Recent Outpatient Visits           1 month ago Benign essential HTN   [place] [facility] [name] DASEN, MD   1 month ago Benign essential HTN   [place] [facility] Duanne, Butler DASEN, MD   4 months ago Pre-ulcerative corn or callous   [place] [facility] [name] DASEN, MD   1 year ago Ankylosing spondylitis of multiple sites in spine [facility])   [place] [facility] Pickard, Butler DASEN, MD

## 2023-12-12 ENCOUNTER — Telehealth: Payer: Self-pay

## 2023-12-12 ENCOUNTER — Other Ambulatory Visit: Payer: Self-pay

## 2023-12-12 DIAGNOSIS — M450 Ankylosing spondylitis of multiple sites in spine: Secondary | ICD-10-CM

## 2023-12-12 MED ORDER — HYDROCODONE-ACETAMINOPHEN 7.5-325 MG PO TABS: 1.0000 | ORAL_TABLET | Freq: Four times a day (QID) | ORAL | 0 refills | Status: DC | PRN

## 2023-12-12 NOTE — Telephone Encounter (Signed)
 Copied from CRM #[id number]. Topic: Clinical - Medication Refill >> Dec 08, 2023  1:57 PM Shardie S wrote: Medication: HYDROcodone -acetaminophen  (NORCO) 7.5-325 MG tablet  Has the patient contacted their pharmacy? Yes (Agent: If no, request that the patient contact the pharmacy for the refill. If patient does not wish to contact the pharmacy document the reason why and proceed with request.) (Agent: If yes, when and what did the pharmacy advise?)  This is the patient's preferred pharmacy:  [facility] #[id number] [place], [place] - [address] AT [facility] OF [facility] [address] & [facility] [address] [place] [address] Phone: [phone] Fax: [phone]   Is this the correct pharmacy for this prescription? Yes If no, delete pharmacy and type the correct one.   Has the prescription been filled recently? No  Is the patient out of the medication? Yes  Has the patient been seen for an appointment in the last year OR does the patient have an upcoming appointment? Yes  Can we respond through MyChart? Yes  Agent: Please be advised that Rx refills may take up to 3 business days. We ask that you follow-up with your pharmacy. >> Dec 12, 2023  1:21 PM Amy B wrote: 2nd attempt:  Patient called again requesting refill of HYDROcodone -acetaminophen  (NORCO) 7.5-325 MG tablet.  He states he has no medication left and does not understand why this has not been refilled yet.  Please contact patient with any issues regarding this medication [phone]

## 2024-01-01 ENCOUNTER — Other Ambulatory Visit: Payer: Self-pay | Admitting: Family Medicine

## 2024-01-01 DIAGNOSIS — M450 Ankylosing spondylitis of multiple sites in spine: Secondary | ICD-10-CM

## 2024-01-01 NOTE — Telephone Encounter (Unsigned)
 Copied from CRM #[id number]. Topic: Clinical - Medication Refill >> Jan 01, 2024 10:46 AM Delon T wrote: Medication: tiZANidine  (ZANAFLEX ) 4 MG tablet HYDROcodone -acetaminophen  (NORCO) 7.5-325 MG tablet- too soon but need it done before Christmas- this one goes to [facility]  Has the patient contacted their pharmacy? No (Agent: If no, request that the patient contact the pharmacy for the refill. If patient does not wish to contact the pharmacy document the reason why and proceed with request.) (Agent: If yes, when and what did the pharmacy advise?)  This is the patient's preferred pharmacy:    [facility] [address], [place] - [address] [place] [address] Phone: [phone] Fax: [phone]  Is this the correct pharmacy for this prescription? Yes If no, delete pharmacy and type the correct one.   Has the prescription been filled recently? Yes  Is the patient out of the medication? Yes  Has the patient been seen for an appointment in the last year OR does the patient have an upcoming appointment? Yes  Can we respond through MyChart? Yes  Agent: Please be advised that Rx refills may take up to 3 business days. We ask that you follow-up with your pharmacy.

## 2024-01-03 NOTE — Telephone Encounter (Signed)
 Too soon for refill.  Requested Prescriptions  Pending Prescriptions Disp Refills   hydrochlorothiazide  (HYDRODIURIL ) 25 MG tablet 90 tablet 3    Sig: Take 1 tablet (25 mg total) by mouth daily.     Cardiovascular: Diuretics - Thiazide Failed - 01/03/2024  4:04 PM      Failed - Last BP in normal range    BP Readings from Last 1 Encounters:  11/28/23 (!) 146/78         Passed - Cr in normal range and within 180 days    Creat  Date Value Ref Range Status  10/27/2023 0.75 0.70 - 1.35 mg/dL Final   Creatinine,U  Date Value Ref Range Status  09/06/2013 401.22 mg/dL Final    Comment:    Result repeated and verified. Result confirmed by automatic dilution.    Cutoff Values for Urine Drug Screen, Pain Mgmt          Drug Class           Cutoff (ng/mL)          Amphetamines             500          Barbiturates             200          Cocaine Metabolites      150          Benzodiazepines          200          Methadone                300          Opiates                  300          Phencyclidine             25          Propoxyphene             300          Marijuana Metabolites     50    For medical purposes only.         Passed - K in normal range and within 180 days    Potassium  Date Value Ref Range Status  10/27/2023 4.3 3.5 - 5.3 mmol/L Final         Passed - Na in normal range and within 180 days    Sodium  Date Value Ref Range Status  10/27/2023 140 135 - 146 mmol/L Final         Passed - Valid encounter within last 6 months    Recent Outpatient Visits           2 months ago Benign essential HTN   [place] [facility] [name] DASEN, [place]   2 months ago Benign essential HTN   [place] [facility] Duanne, Butler DASEN, MD   5 months ago Pre-ulcerative corn or callous   [place] [facility] [name] DASEN, MD   1 year ago Ankylosing spondylitis of multiple sites in spine [facility])   [place]  [facility] [place], Butler DASEN, MD               tiZANidine  (ZANAFLEX ) 4 MG tablet 90 tablet 0    Sig: Take 1 tablet (4 mg  total) by mouth 3 (three) times daily.     Not Delegated - Cardiovascular:  Alpha-2 Agonists - tizanidine  Failed - 01/03/2024  4:04 PM      Failed - This refill cannot be delegated      Passed - Valid encounter within last 6 months    Recent Outpatient Visits           2 months ago Benign essential HTN   [place] [facility] [name] DASEN, MD   2 months ago Benign essential HTN   [place] [facility] Duanne, Butler DASEN, MD   5 months ago Pre-ulcerative corn or callous   [place] [facility] [name] DASEN, MD   1 year ago Ankylosing spondylitis of multiple sites in spine [facility])   [place] [facility] Pickard, Butler DASEN, MD

## 2024-01-04 NOTE — Telephone Encounter (Signed)
 Copied from CRM #[id number]. Topic: Clinical - Prescription Issue >> Jan 04, 2024 10:16 AM Alfonso ORN wrote: Reason for CRM: pt called to f/u on refill request for:  tiZANidine  (ZANAFLEX ) 4 MG tablet, HYDROcodone -acetaminophen  (NORCO) 7.5-325 MG . Advised per note was too soon to fill rx. Pt wants to ensure that he gets fill on meds prior to Christmas so that he isnt without medication and unsure of when to submit order. Please advise   Callback: [phone]

## 2024-01-08 ENCOUNTER — Other Ambulatory Visit: Payer: Self-pay | Admitting: Family Medicine

## 2024-01-08 ENCOUNTER — Telehealth: Payer: Self-pay

## 2024-01-08 DIAGNOSIS — M450 Ankylosing spondylitis of multiple sites in spine: Secondary | ICD-10-CM

## 2024-01-08 MED ORDER — HYDROCODONE-ACETAMINOPHEN 7.5-325 MG PO TABS: 1.0000 | ORAL_TABLET | Freq: Four times a day (QID) | ORAL | 0 refills | Status: AC | PRN

## 2024-01-08 MED ORDER — TIZANIDINE HCL 4 MG PO TABS: 4.0000 mg | ORAL_TABLET | Freq: Three times a day (TID) | ORAL | 0 refills | Status: DC

## 2024-01-08 NOTE — Telephone Encounter (Signed)
 Copied from CRM #[id number]. Topic: Clinical - Prescription Issue >> Jan 08, 2024  3:52 PM Delon DASEN wrote: Reason for CRM: tiZANidine  (ZANAFLEX ) 4 MG tablet - need this sent to [facility] [address], it was sent to [facility] in error.   Only wants HYDROcodone -acetaminophen  (NORCO) 7.5-325 MG tablet sent to [facility] .

## 2024-01-08 NOTE — Telephone Encounter (Signed)
 Copied from CRM #[id number]. Topic: Clinical - Medication Question >> Jan 08, 2024 10:29 AM Revonda D wrote: Reason for CRM: pt called to f/u on refill request for:  tiZANidine  (ZANAFLEX ) 4 MG tablet, HYDROcodone -acetaminophen  (NORCO) 7.5-325 MG . Advised per note was too soon to fill rx. Pt wants to ensure that he gets fill on meds prior to Christmas so that he isnt without medication and unsure of when to submit order. Please advise Callback: [phone]  UPDATE: Pt is calling back to check the update on the refill request for the tiZANidine  (ZANAFLEX ) 4 MG tablet, and HYDROcodone -acetaminophen  (NORCO) 7.5-325 MG. Pt stated that the tiZANidine  was last filled on 11/14 and the HYDROcodone -acetaminophen  was last filled on 11/25. Pt stated that he needs these medications refilled because he is currently out and doesn't want the refill to be delayed due to holidays. Pt would like a callback today with an update.

## 2024-01-10 ENCOUNTER — Telehealth: Payer: Self-pay | Admitting: Family Medicine

## 2024-01-10 NOTE — Telephone Encounter (Signed)
 General 12/13/2023  8:08 AM Debarah Service L Closed Referral / Expired / Patient canceled appt. - Note: Closed Referral / Expired / Patient canceled appt  Copied from CRM #[id number]. Topic: Referral - Question >> Aug 14, 2023 11:52 AM Donna BRAVO wrote: Reason for CRM: patient asking for update on pain management referral, patient would like to see a pain provider in [place] not [place]

## 2024-01-10 NOTE — Telephone Encounter (Signed)
 Dermatology: provided Dr [name] Peers dermatology information--Spoke to pt who states our next opening in March is too far out. He declined to sch at this time  Ophthalmology: Dr Octavia is unable to help patient. Patient requesting Retina specialist--referral updated and sent 01/10/2024  Pain management: Dr Bilal Danish Lateff  information--pt has a scheduled appt but called to cancel it.  Copied from CRM #[id number]. Topic: Referral - Status >> Sep 22, 2023 11:10 AM Donna BRAVO wrote: Reason for CRM: patient inquiring about referrals: Dermatology: provided Dr [name] Peers dermatology information  Ophthalmology: Dr Octavia is unable to help patient. Patient requesting Retina specialist   Pain management: Dr Wallie Maranda Nakai  information >> Oct 05, 2023 10:25 AM Montie POUR wrote: Dr. Georgette office called and Dr. Elner cannot accept Mr. Kollman as a patient. Mr. Loberg does not have diabetes. Please refer to a different doctor. Thanks

## 2024-01-10 NOTE — Telephone Encounter (Signed)
 Requested medication (s) are due for refill today: Yes  Requested medication (s) are on the active medication list: Yes  Last refill:  01/08/24  Future visit scheduled: Yes  Notes to clinic:  Sent to the wrong pharmacy on 12/22, resend to correct pharmacy, not delegated.     Requested Prescriptions  Pending Prescriptions Disp Refills   tiZANidine  (ZANAFLEX ) 4 MG tablet 90 tablet 0    Sig: Take 1 tablet (4 mg total) by mouth 3 (three) times daily.     Not Delegated - Cardiovascular:  Alpha-2 Agonists - tizanidine  Failed - 01/10/2024 12:41 PM      Failed - This refill cannot be delegated      Passed - Valid encounter within last 6 months    Recent Outpatient Visits           2 months ago Benign essential HTN   [name] D[facility]EN, [facility]   2 months ago Benign essential HTN   [place] [facility] Duanne, Butler DASEN, MD   5 months ago Pre-ulcerative corn or callous   [place] [facility] [name] DASEN, MD   1 year ago Ankylosing spondylitis of multiple sites in spine [facility])    [facility] Pickard, Butler DASEN, MD

## 2024-02-05 ENCOUNTER — Other Ambulatory Visit: Payer: Self-pay | Admitting: Family Medicine

## 2024-02-05 DIAGNOSIS — M450 Ankylosing spondylitis of multiple sites in spine: Secondary | ICD-10-CM

## 2024-02-05 NOTE — Telephone Encounter (Unsigned)
 Copied from CRM [id number]. Topic: Clinical - Medication Refill >> Feb 05, 2024 11:10 AM Larissa RAMAN wrote: Medication:  HYDROcodone -acetaminophen  (NORCO) 7.5-325 MG tablet   Has the patient contacted their pharmacy? Yes (Agent: If no, request that the patient contact the pharmacy for the refill. If patient does not wish to contact the pharmacy document the reason why and proceed with request.) (Agent: If yes, when and what did the pharmacy advise?)  This is the patient's preferred pharmacy:  [id number] [place], [place] - [address] AT [facility] [address] [facility] [address] [facility] [address] [place] [address] Phone: [phone] Fax: [phone]    Is this the correct pharmacy for this prescription? Yes If no, delete pharmacy and type the correct one.   Has the prescription been filled recently? No  Is the patient out of the medication? Yes  Has the patient been seen for an appointment in the last year OR does the patient have an upcoming appointment? Yes  Can we respond through MyChart? No  Agent: Please be advised that Rx refills may take up to 3 business days. We ask that you follow-up with your pharmacy.

## 2024-02-05 NOTE — Telephone Encounter (Unsigned)
 Copied from CRM #[id number]. Topic: Clinical - Medication Refill >> Feb 05, 2024 11:13 AM Larissa RAMAN wrote: Medication: tiZANidine  (ZANAFLEX ) 4 MG tablet  Has the patient contacted their pharmacy? Yes (Agent: If no, request that the patient contact the pharmacy for the refill. If patient does not wish to contact the pharmacy document the reason why and proceed with request.) (Agent: If yes, when and what did the pharmacy advise?)  This is the patient's preferred pharmacy:    [facility] [address] [place] - [address] [place]  [address] Phone: [phone] Fax: [phone]  Is this the correct pharmacy for this prescription? Yes If no, delete pharmacy and type the correct one.   Has the prescription been filled recently? No  Is the patient out of the medication? Yes  Has the patient been seen for an appointment in the last year OR does the patient have an upcoming appointment? Yes  Can we respond through MyChart? No  Agent: Please be advised that Rx refills may take up to 3 business days. We ask that you follow-up with your pharmacy.

## 2024-02-06 MED ORDER — HYDROCODONE-ACETAMINOPHEN 7.5-325 MG PO TABS: 1.0000 | ORAL_TABLET | Freq: Four times a day (QID) | ORAL | 0 refills | Status: AC | PRN

## 2024-02-06 MED ORDER — TIZANIDINE HCL 4 MG PO TABS: 4.0000 mg | ORAL_TABLET | Freq: Three times a day (TID) | ORAL | 0 refills | Status: AC

## 2024-02-06 NOTE — Telephone Encounter (Signed)
 Requested medications are due for refill today.  2 days early  Requested medications are on the active medications list.  yes  Last refill. 01/08/2024 #90 0 rf  Future visit scheduled.   A wellness visit is scheduled  Notes to clinic.  Refill not delegated    Requested Prescriptions  Pending Prescriptions Disp Refills   tiZANidine  (ZANAFLEX ) 4 MG tablet 90 tablet 0    Sig: Take 1 tablet (4 mg total) by mouth 3 (three) times daily.     Not Delegated - Cardiovascular:  Alpha-2 Agonists - tizanidine  Failed - 02/06/2024 11:24 AM      Failed - This refill cannot be delegated      Passed - Valid encounter within last 6 months    Recent Outpatient Visits           3 months ago Benign essential HTN   [place] [facility] [name] DASEN, MD   3 months ago Benign essential HTN   [place] [facility] Duanne, Butler DASEN, MD   6 months ago Pre-ulcerative corn or callous   [place] [facility] [name] DASEN, MD   1 year ago Ankylosing spondylitis of multiple sites in spine [facility])   [place] [facility] Pickard, Butler DASEN, MD

## 2024-02-06 NOTE — Telephone Encounter (Signed)
 Requested medication (s) are due for refill today: Yes  Requested medication (s) are on the active medication list: Yes  Last refill:  01/08/24  Future visit scheduled: Yes  Notes to clinic:  Not delegated.    Requested Prescriptions  Pending Prescriptions Disp Refills   tiZANidine  (ZANAFLEX ) 4 MG tablet 90 tablet 0    Sig: Take 1 tablet (4 mg total) by mouth 3 (three) times daily.     Not Delegated - Cardiovascular:  Alpha-2 Agonists - tizanidine  Failed - 02/06/2024 11:18 AM      Failed - This refill cannot be delegated      Passed - Valid encounter within last 6 months    Recent Outpatient Visits           3 months ago Benign essential HTN   [place] [facility] [name] DASEN, MD   3 months ago Benign essential HTN   [place] [facility] Duanne, Butler DASEN, MD   6 months ago Pre-ulcerative corn or callous   [place] [facility] [name] DASEN, MD   1 year ago Ankylosing spondylitis of multiple sites in spine [facility])   [place] [facility] Pickard, Butler DASEN, MD

## 2024-02-06 NOTE — Telephone Encounter (Signed)
 Requested medications are due for refill today.  2 days early  Requested medications are on the active medications list.  yes  Last refill. 01/08/2024 #120  Future visit scheduled.   yes  Notes to clinic.  Refill not delegated.    Requested Prescriptions  Pending Prescriptions Disp Refills   HYDROcodone -acetaminophen  (NORCO) 7.5-325 MG tablet 120 tablet 0    Sig: Take 1 tablet by mouth every 6 (six) hours as needed.     Not Delegated - Analgesics:  Opioid Agonist Combinations Failed - 02/06/2024 11:13 AM      Failed - This refill cannot be delegated      Failed - Urine Drug Screen completed in last 360 days      Failed - Valid encounter within last 3 months    Recent Outpatient Visits           3 months ago Benign essential HTN   [place] [facility] [name] DASEN, MD   3 months ago Benign essential HTN   [place] [facility] Duanne, Butler DASEN, MD   6 months ago Pre-ulcerative corn or callous   [place] [facility] [name] DASEN, MD   1 year ago Ankylosing spondylitis of multiple sites in spine [facility])   [facility] Pickard, Butler DASEN, MD

## 2024-03-14 ENCOUNTER — Ambulatory Visit: Payer: Medicare HMO

## 2024-05-02 ENCOUNTER — Encounter

## 2024-05-28 ENCOUNTER — Ambulatory Visit (INDEPENDENT_AMBULATORY_CARE_PROVIDER_SITE_OTHER): Admitting: Otolaryngology
